# Patient Record
Sex: Male | Born: 1945 | Race: White | Hispanic: No | Smoking: Never smoker
Health system: Southern US, Community
[De-identification: ages and names within clinical notes are randomized; demographics above are authoritative.]

## PROBLEM LIST (undated history)

## (undated) ENCOUNTER — Emergency Department: Payer: Self-pay | Source: Home / Self Care

## (undated) HISTORY — PX: TONSILLECTOMY: SUR1361

## (undated) HISTORY — PX: APPENDECTOMY: SHX54

## (undated) HISTORY — PX: OTHER SURGICAL HISTORY: SHX169

---

## 2016-03-26 ENCOUNTER — Ambulatory Visit (INDEPENDENT_AMBULATORY_CARE_PROVIDER_SITE_OTHER): Payer: Medicare Other | Admitting: Internal Medicine

## 2016-03-26 VITALS — BP 138/84 | HR 72 | Temp 97.6°F | Resp 16 | Ht 68.5 in | Wt 232.2 lb

## 2016-03-26 DIAGNOSIS — R0789 Other chest pain: Secondary | ICD-10-CM | POA: Diagnosis not present

## 2016-03-26 NOTE — Progress Notes (Signed)
   Subjective:  By signing my name below, I, Stann Oresung-Kai Tsai, attest that this documentation has been prepared under the direction and in the presence of Ellamae Siaobert Tomothy Eddins, MD. Electronically Signed: Stann Oresung-Kai Tsai, Scribe. 03/26/2016 , 1:54 PM .  Patient was seen in Room 14 .   Patient ID: Steve Buchanan, male    DOB: 08/02/1946, 70 y.o.   MRN: [id number] Chief Complaint  Patient presents with  . Bronchitis   HPI Steve Buchanan is a 70 y.o. male who has h/o bronchitis [facility] complaining of left side chest discomfort ongoing for a few days. Patient states he's had intermittent bronchitis since he was in college and usually occurs in winter. He's taken mucinex for this and soreness hasn't resolved. He denies fever, coughing up at night, wheezing, or shortness of breath. He denies history of asthma.   He's been helping his ex-wife here in [place]. His ex-wife has been taking care of her father at home.   There are no active problems to display for this patient. no ongoing problems or meds No current outpatient prescriptions on file. Not on File  Review of Systems  Constitutional: Negative for fever, chills, appetite change and fatigue.  HENT: Negative for rhinorrhea and sore throat.   Respiratory: Positive for chest tightness. Negative for cough, shortness of breath and wheezing.   Gastrointestinal: Negative for nausea, vomiting, abdominal pain and diarrhea.      Objective:   Physical Exam  Constitutional: He is oriented to person, place, and time. He appears well-developed and well-nourished. No distress.  HENT:  Head: Normocephalic and atraumatic.  Eyes: EOM are normal. Pupils are equal, round, and reactive to light.  Neck: Neck supple.  Cardiovascular: Normal rate, regular rhythm and normal heart sounds.   No murmur heard. Pulmonary/Chest: Effort normal and breath sounds normal. No respiratory distress. He has no wheezes.  tender along left axillary line at rib margin  without swelling or defect  Musculoskeletal: Normal range of motion.  Neurological: He is alert and oriented to person, place, and time.  Skin: Skin is warm and dry.  Psychiatric: He has a normal mood and affect. His behavior is normal.  Nursing note and vitals reviewed.   BP 138/84 mmHg  Pulse 72  Temp(Src) 97.6 F (36.4 C)  Resp 16  Ht 5' 8.5" (1.74 m)  Wt 232 lb 3.2 oz (105.325 kg)  BMI 34.79 kg/m2  SpO2 96%    Assessment & Plan:  Chest wall pain  Stretch/heat/ibuprof F/u 2 w if not well  I have completed the patient encounter in its entirety as documented by the scribe, with editing by me where necessary. Ambre Kobayashi P. Merla Richesoolittle, M.D.

## 2016-03-26 NOTE — Patient Instructions (Signed)
     IF you received an x-ray today, you will receive an invoice from [facility]. Please contact  Radiology at [phone] with questions or concerns regarding your invoice.   IF you received labwork today, you will receive an invoice from [facility]. Please contact [facility] at [phone] with questions or concerns regarding your invoice.   Our billing staff will not be able to assist you with questions regarding bills from these companies.  You will be contacted with the lab results as soon as they are available. The fastest way to get your results is to activate your My Chart account. Instructions are located on the last page of this paperwork. If you have not heard from us regarding the results in 2 weeks, please contact this office.      

## 2016-04-14 ENCOUNTER — Ambulatory Visit (INDEPENDENT_AMBULATORY_CARE_PROVIDER_SITE_OTHER): Payer: Medicare Other

## 2016-04-14 ENCOUNTER — Ambulatory Visit (INDEPENDENT_AMBULATORY_CARE_PROVIDER_SITE_OTHER): Payer: Medicare Other | Admitting: Family Medicine

## 2016-04-14 VITALS — BP 138/88 | HR 79 | Temp 98.3°F | Resp 16 | Ht 69.0 in | Wt 231.0 lb

## 2016-04-14 DIAGNOSIS — R059 Cough, unspecified: Secondary | ICD-10-CM

## 2016-04-14 DIAGNOSIS — R05 Cough: Secondary | ICD-10-CM

## 2016-04-14 DIAGNOSIS — R0789 Other chest pain: Secondary | ICD-10-CM

## 2016-04-14 DIAGNOSIS — M545 Low back pain, unspecified: Secondary | ICD-10-CM

## 2016-04-14 DIAGNOSIS — Z8679 Personal history of other diseases of the circulatory system: Secondary | ICD-10-CM | POA: Diagnosis not present

## 2016-04-14 DIAGNOSIS — Z87898 Personal history of other specified conditions: Secondary | ICD-10-CM

## 2016-04-14 DIAGNOSIS — J3489 Other specified disorders of nose and nasal sinuses: Secondary | ICD-10-CM

## 2016-04-14 MED ORDER — FLUTICASONE PROPIONATE 50 MCG/ACT NA SUSP: 1.0000 | Freq: Every day | NASAL | Status: DC

## 2016-04-14 MED ORDER — BENZONATATE 100 MG PO CAPS: 100.0000 mg | ORAL_CAPSULE | Freq: Three times a day (TID) | ORAL | Status: DC | PRN

## 2016-04-14 MED ORDER — ALBUTEROL SULFATE HFA 108 (90 BASE) MCG/ACT IN AERS
1.0000 | INHALATION_SPRAY | RESPIRATORY_TRACT | Status: DC | PRN
Start: 2016-04-14 — End: 2018-11-08

## 2016-04-14 MED ORDER — CYCLOBENZAPRINE HCL 5 MG PO TABS: ORAL_TABLET | ORAL | Status: DC

## 2016-04-14 NOTE — Progress Notes (Signed)
Subjective:  By signing my name below, I, Stann Ore, attest that this documentation has been prepared under the direction and in the presence of Meredith Staggers, MD. Electronically Signed: Stann Ore, Scribe. 04/14/2016 , 3:30 PM .  Patient was seen in Room 14 .   Patient ID: Steve Buchanan, male    DOB: [birth year], 70 y.o.   MRN: [id number] Chief Complaint  Patient presents with  . Back Pain    lower left   HPI Steve Buchanan is a 70 y.o. male Here for back pain and elevated BP during triage. Of note, BP was normal during June 21st visit.   Patient was seen here about 2.5 weeks ago with left side chest discomfort ongoing for a few days at that time. He reports the discomfort has radiated towards his right side chest and to his back now. He states his cough has been worsening with continued humidity in the area and when he's outside. He reports coughing so much yesterday, "it feels like he was in a straight jacket". He's been taking aleve once a day or 1 every other day. He's also been taking nyquil for cough and slight rhinorrhea.   He denies history of asthma. He denies heartburn in years. He denies postnasal drip or sore throat. He denies chest tightness, chest pain, or shortness of breath with exertion or activity. He denies heart issues. He denies history of HTN. He denies any recent tobacco use. He denies urinary symptoms, pain in legs, or leg swelling. He denies history of DVT's.   He mentions taking cholesterol medication in the past.   He lives [place], [place]. He's here in [place], helping his ex-wife take care of her father.   There are no active problems to display for this patient.  History reviewed. No pertinent past medical history. Past Surgical History  Procedure Laterality Date  . Appendectomy     No Known Allergies Prior to Admission medications   Medication Sig Start Date End Date Taking? Authorizing Provider  Naproxen Sodium (ALEVE PO) Take by  mouth.   Yes Historical Provider, MD   Social History   Social History  . Marital Status: Divorced    Spouse Name: N/A  . Number of Children: N/A  . Years of Education: N/A   Occupational History  . Not on file.   Social History Main Topics  . Smoking status: Never Smoker   . Smokeless tobacco: Not on file  . Alcohol Use: Not on file  . Drug Use: Not on file  . Sexual Activity: Not on file   Other Topics Concern  . Not on file   Social History Narrative   Review of Systems  Constitutional: Negative for fatigue and unexpected weight change.  HENT: Positive for rhinorrhea.   Eyes: Negative for visual disturbance.  Respiratory: Positive for cough. Negative for chest tightness and shortness of breath.   Cardiovascular: Negative for chest pain, palpitations and leg swelling.  Gastrointestinal: Negative for abdominal pain and blood in stool.  Musculoskeletal: Positive for back pain.  Neurological: Negative for dizziness, light-headedness and headaches.       Objective:   Physical Exam  Constitutional: He is oriented to person, place, and time. He appears well-developed and well-nourished.  HENT:  Head: Normocephalic and atraumatic.  Eyes: EOM are normal. Pupils are equal, round, and reactive to light.  Neck: No JVD present. Carotid bruit is not present.  Cardiovascular: Normal rate, regular rhythm and normal heart sounds.   No  murmur heard. Pulmonary/Chest: Effort normal and breath sounds normal. He has no rales.  Abdominal: There is no CVA tenderness.  Musculoskeletal: He exhibits no edema.  Minimal tenderness over left paraspinal of the upper L-spine, no midline bony tenderness, calves non tender, no lower extremity edema  Neurological: He is alert and oriented to person, place, and time.  Skin: Skin is warm and dry.  Psychiatric: He has a normal mood and affect.  Vitals reviewed.   Filed Vitals:   04/14/16 1430 04/14/16 1454  BP: 144/82 155/104  Pulse: 79     Temp: 98.3 F (36.8 C)   Resp: 16   Height: 5\' 9"  (1.753 m)   Weight: 231 lb (104.781 kg)   SpO2: 97%    EKG: sinus rhythm, some baseline artifact, non specific t-wave in lead III, no acute findings  Dg Chest 2 View  04/14/2016  CLINICAL DATA:  70 year old male with posterior chest wall pain for several weeks, nonproductive cough. EXAM: CHEST  2 VIEW COMPARISON:  None. FINDINGS: There is tortuosity of the thoracic aorta. No definite calcified aortic atherosclerosis. Cardiac size is within normal limits, but there is evidence of a small to moderate gastric hiatal hernia. Other mediastinal contours are within normal limits. Visualized tracheal air column is within normal limits. One or more small calcified granulomas project in the left mid lung near the hilum. No pneumothorax or pulmonary edema. No pleural effusion or other confluent pulmonary opacity. Exaggerated thoracic kyphosis. Degenerative endplate spurring in the thoracic spine. No acute osseous abnormality identified. IMPRESSION: No acute cardiopulmonary abnormality. Tortuous thoracic aorta. Gastric hiatal hernia also suspected. Electronically Signed   By: Odessa Fleming M.D.   On: 04/14/2016 16:01      Assessment & Plan:    Steve Buchanan is a 70 y.o. male Cough - Plan: D-dimer, quantitative (not at [facility]), DG Chest 2 View, fluticasone (FLONASE) 50 MCG/ACT nasal spray, albuterol (PROVENTIL HFA;VENTOLIN HFA) 108 (90 Base) MCG/ACT inhaler, benzonatate (TESSALON) 100 MG capsule  Chest wall pain - Plan: D-dimer, quantitative (not at [facility]), DG Chest 2 View  Left-sided low back pain without sciatica - Plan: cyclobenzaprine (FLEXERIL) 5 MG tablet  History of palpitations - Plan: EKG 12-Lead, TSH  Rhinorrhea - Plan: fluticasone (FLONASE) 50 MCG/ACT nasal spray  Persistent cough with associated chest wall pain and left low back pain by exam. No acute concerning findings on chest x-ray, lungs clear currently. Notices symptoms when around  heat, may have reactive airway component. Also with hiatal hernia, may have reflux component as well as postnasal drip from allergies.   -We'll try to treat for multiple causes: avoid foods that cause reflux, over-the-counter Zantac if needed, and start Flonase for postnasal drip. Tessalon Perles or Mucinex DM as needed for cough, and a trial of albuterol for possible reactive airway.   - for chest wall pain, back pain, can try Flexeril at bedtime, nsaid during the day if his blood pressure is not elevated.  -Check d-dimer as travels back and forth to [facility], but no other known risk factor for DVT, or previous calf pain/swelling.Marland Kitchen  Re: Palpitations, no concerning findings on EKG, copy given to patient. TSH pending as history of thyroid disease, but advised to follow-up with his primary care provider back in [place] for this discussion further. ER/RTC/911 precautions given in the meantime.  Meds ordered this encounter  Medications  . Naproxen Sodium (ALEVE PO)    Sig: Take by mouth.  . fluticasone (FLONASE) 50 MCG/ACT nasal spray  Sig: Place 1 spray into both nostrils daily.    Dispense:  16 g    Refill:  1  . albuterol (PROVENTIL HFA;VENTOLIN HFA) 108 (90 Base) MCG/ACT inhaler    Sig: Inhale 1-2 puffs into the lungs every 4 (four) hours as needed for wheezing or shortness of breath.    Dispense:  1 Inhaler    Refill:  0  . benzonatate (TESSALON) 100 MG capsule    Sig: Take 1 capsule (100 mg total) by mouth 3 (three) times daily as needed for cough.    Dispense:  20 capsule    Refill:  0  . cyclobenzaprine (FLEXERIL) 5 MG tablet    Sig: 1 pill by mouth up to every 8 hours as needed. Start with one pill by mouth each bedtime as needed due to sedation    Dispense:  15 tablet    Refill:  0   Patient Instructions       IF you received an x-ray today, you will receive an invoice from [facility]. Please contact [facility] at [phone] with questions or  concerns regarding your invoice.   IF you received labwork today, you will receive an invoice from [employer]. Please contact [facility] at [phone] with questions or concerns regarding your invoice.   Our billing staff will not be able to assist you with questions regarding bills from these companies.  You will be contacted with the lab results as soon as they are available. The fastest way to get your results is to activate your My Chart account. Instructions are located on the last page of this paperwork. If you have not heard from [place] regarding the results in 2 weeks, please contact this office.     See information below on cough. Often times a cough such as yours can be due to multiple causes, including heartburn, allergies with postnasal drip, or irritation of the airways. Avoid foods known to cause heartburn, and I have listed below. You can try the Flonase nasal spray to see if allergies may be triggering some of your cough. During times of cough you can try the albuterol inhaler to see if there is some reactive airway or wheezing component to your cough. If this helps, there are other medications you can use daily to suppress your cough - this can be discussed with your primary care provider at follow up. I also checked a blood clot test, but I suspect this will be normal. If elevated, further testing with a CAT scan may be needed. We will let you know if this is the case.  I would also like you to meet with your primary care provider when you get back in town regarding your palpitations, and previous thyroid issues. I did check a thyroid test here today, and will have the results back within the next 2 weeks. You can also take a copy of your EKG that was performed today to your doctor when you return to [facility].  Tessalon Perles may be helpful to block the cough during the day, or if these are too expensive, can try over-the-counter Mucinex DM. Aleve or  ibuprofen is okay for the chest wall pain, but watch your blood pressure as it was elevated today, and these medicines can also cause elevation of blood pressure. The pain may also be radiating pain from your back, and I have provided a muscle relaxer at bedtime if this is helpful. Follow-up with your primary care provider in the next 1-2  weeks regarding your back, as well as other issues as above.  Return to the clinic or go to the nearest emergency room if any of your symptoms worsen or new symptoms occur.   Cough, Adult Coughing is a reflex that clears your throat and your airways. Coughing helps to heal and protect your lungs. It is normal to cough occasionally, but a cough that happens with other symptoms or lasts a long time may be a sign of a condition that needs treatment. A cough may last only 2-3 weeks (acute), or it may last longer than 8 weeks (chronic). CAUSES Coughing is commonly caused by:  Breathing in substances that irritate your lungs.  A viral or bacterial respiratory infection.  Allergies.  Asthma.  Postnasal drip.  Smoking.  Acid backing up from the stomach into the esophagus (gastroesophageal reflux).  Certain medicines.  Chronic lung problems, including COPD (or rarely, lung cancer).  Other medical conditions such as heart failure. HOME CARE INSTRUCTIONS  Pay attention to any changes in your symptoms. Take these actions to help with your discomfort:  Take medicines only as told by your health care provider.  If you were prescribed an antibiotic medicine, take it as told by your health care provider. Do not stop taking the antibiotic even if you start to feel better.  Talk with your health care provider before you take a cough suppressant medicine.  Drink enough fluid to keep your urine clear or pale yellow.  If the air is dry, use a cold steam vaporizer or humidifier in your bedroom or your home to help loosen secretions.  Avoid anything that causes  you to cough at work or at home.  If your cough is worse at night, try sleeping in a semi-upright position.  Avoid cigarette smoke. If you smoke, quit smoking. If you need help quitting, ask your health care provider.  Avoid caffeine.  Avoid alcohol.  Rest as needed. SEEK MEDICAL CARE IF:   You have new symptoms.  You cough up pus.  Your cough does not get better after 2-3 weeks, or your cough gets worse.  You cannot control your cough with suppressant medicines and you are losing sleep.  You develop pain that is getting worse or pain that is not controlled with pain medicines.  You have a fever.  You have unexplained weight loss.  You have night sweats. SEEK IMMEDIATE MEDICAL CARE IF:  You cough up blood.  You have difficulty breathing.  Your heartbeat is very fast.   This information is not intended to replace advice given to you by your health care provider. Make sure you discuss any questions you have with your health care provider.   Document Released: 03/21/2011 Document Revised: 06/13/2015 Document Reviewed: 11/29/2014 Elsevier Interactive Patient Education 2016 [employer]  Food Choices for Gastroesophageal Reflux Disease, Adult When you have gastroesophageal reflux disease (GERD), the foods you eat and your eating habits are very important. Choosing the right foods can help ease the discomfort of GERD. WHAT GENERAL GUIDELINES DO I NEED TO FOLLOW?  Choose fruits, vegetables, whole grains, low-fat dairy products, and low-fat meat, fish, and poultry.  Limit fats such as oils, salad dressings, butter, nuts, and avocado.  Keep a food diary to identify foods that cause symptoms.  Avoid foods that cause reflux. These may be different for different people.  Eat frequent small meals instead of three large meals each day.  Eat your meals slowly, in a relaxed setting.  Limit fried foods.  Cook foods using methods other than frying.  Avoid drinking  alcohol.  Avoid drinking large amounts of liquids with your meals.  Avoid bending over or lying down until 2-3 hours after eating. WHAT FOODS ARE NOT RECOMMENDED? The following are some foods and drinks that may worsen your symptoms: Vegetables Tomatoes. Tomato juice. Tomato and spaghetti sauce. Chili peppers. Onion and garlic. Horseradish. Fruits Oranges, grapefruit, and lemon (fruit and juice). Meats High-fat meats, fish, and poultry. This includes hot dogs, ribs, ham, sausage, salami, and bacon. Dairy Whole milk and chocolate milk. Sour cream. Cream. Butter. Ice cream. Cream cheese.  Beverages Coffee and tea, with or without caffeine. Carbonated beverages or energy drinks. Condiments Hot sauce. Barbecue sauce.  Sweets/Desserts Chocolate and cocoa. Donuts. Peppermint and spearmint. Fats and Oils High-fat foods, including [place] fries and potato chips. Other Vinegar. Strong spices, such as black pepper, white pepper, red pepper, cayenne, curry powder, cloves, ginger, and chili powder. The items listed above may not be a complete list of foods and beverages to avoid. Contact your dietitian for more information.   This information is not intended to replace advice given to you by your health care provider. Make sure you discuss any questions you have with your health care provider.   Document Released: 09/22/2005 Document Revised: 10/13/2014 Document Reviewed: 07/27/2013 Elsevier Interactive Patient Education 2016 [employer]. Palpitations A palpitation is the feeling that your heartbeat is irregular or is faster than normal. It may feel like your heart is fluttering or skipping a beat. Palpitations are usually not a serious problem. However, in some cases, you may need further medical evaluation. CAUSES  Palpitations can be caused by:  Smoking.  Caffeine or other stimulants, such as diet pills or energy drinks.  Alcohol.  Stress and anxiety.  Strenuous physical  activity.  Fatigue.  Certain medicines.  Heart disease, especially if you have a history of irregular heart rhythms (arrhythmias), such as atrial fibrillation, atrial flutter, or supraventricular tachycardia.  An improperly working pacemaker or defibrillator. DIAGNOSIS  To find the cause of your palpitations, your health care provider will take your medical history and perform a physical exam. Your health care provider may also have you take a test called an ambulatory electrocardiogram (ECG). An ECG records your heartbeat patterns over a 24-hour period. You may also have other tests, such as:  Transthoracic echocardiogram (TTE). During echocardiography, sound waves are used to evaluate how blood flows through your heart.  Transesophageal echocardiogram (TEE).  Cardiac monitoring. This allows your health care provider to monitor your heart rate and rhythm in real time.  Holter monitor. This is a portable device that records your heartbeat and can help diagnose heart arrhythmias. It allows your health care provider to track your heart activity for several days, if needed.  Stress tests by exercise or by giving medicine that makes the heart beat faster. TREATMENT  Treatment of palpitations depends on the cause of your symptoms and can vary greatly. Most cases of palpitations do not require any treatment other than time, relaxation, and monitoring your symptoms. Other causes, such as atrial fibrillation, atrial flutter, or supraventricular tachycardia, usually require further treatment. HOME CARE INSTRUCTIONS   Avoid:  Caffeinated coffee, tea, soft drinks, diet pills, and energy drinks.  Chocolate.  Alcohol.  Stop smoking if you smoke.  Reduce your stress and anxiety. Things that can help you relax include:  A method of controlling things in your body, such as your heartbeats, with your mind (biofeedback).  Yoga.  Meditation.  Physical activity such as swimming, jogging, or  walking.  Get plenty of rest and sleep. SEEK MEDICAL CARE IF:   You continue to have a fast or irregular heartbeat beyond 24 hours.  Your palpitations occur more often. SEEK IMMEDIATE MEDICAL CARE IF:  You have chest pain or shortness of breath.  You have a severe headache.  You feel dizzy or you faint. MAKE SURE YOU:  Understand these instructions.  Will watch your condition.  Will get help right away if you are not doing well or get worse.   This information is not intended to replace advice given to you by your health care provider. Make sure you discuss any questions you have with your health care provider.   Document Released: 09/19/2000 Document Revised: 09/27/2013 Document Reviewed: 11/21/2011 Elsevier Interactive Patient Education 2016 Elsevier Inc.   Chest Wall Pain Chest wall pain is pain in or around the bones and muscles of your chest. Sometimes, an injury causes this pain. Sometimes, the cause may not be known. This pain may take several weeks or longer to get better. HOME CARE INSTRUCTIONS  Pay attention to any changes in your symptoms. Take these actions to help with your pain:   Rest as told by your health care provider.   Avoid activities that cause pain. These include any activities that use your chest muscles or your abdominal and side muscles to lift heavy items.   If directed, apply ice to the painful area:  Put ice in a plastic bag.  Place a towel between your skin and the bag.  Leave the ice on for 20 minutes, 2-3 times per day.  Take over-the-counter and prescription medicines only as told by your health care provider.  Do not use tobacco products, including cigarettes, chewing tobacco, and e-cigarettes. If you need help quitting, ask your health care provider.  Keep all follow-up visits as told by your health care provider. This is important. SEEK MEDICAL CARE IF:  You have a fever.  Your chest pain becomes worse.  You have new  symptoms. SEEK IMMEDIATE MEDICAL CARE IF:  You have nausea or vomiting.  You feel sweaty or light-headed.  You have a cough with phlegm (sputum) or you cough up blood.  You develop shortness of breath.   This information is not intended to replace advice given to you by your health care provider. Make sure you discuss any questions you have with your health care provider.   Document Released: 09/22/2005 Document Revised: 06/13/2015 Document Reviewed: 12/18/2014 Elsevier Interactive Patient Education [employer]    I personally performed the services described in this documentation, which was scribed in my presence. The recorded information has been reviewed and considered, and addended by me as needed.   Signed,   Meredith Staggers, MD [facility].  04/14/2016 4:10 PM

## 2016-04-14 NOTE — Patient Instructions (Addendum)
IF you received an x-ray today, you will receive an invoice from [facility]. Please contact [facility] at [phone] with questions or concerns regarding your invoice.   IF you received labwork today, you will receive an invoice from [employer]. Please contact [facility] at [phone] with questions or concerns regarding your invoice.   Our billing staff will not be able to assist you with questions regarding bills from these companies.  You will be contacted with the lab results as soon as they are available. The fastest way to get your results is to activate your My Chart account. Instructions are located on the last page of this paperwork. If you have not heard from [place] regarding the results in 2 weeks, please contact this office.     See information below on cough. Often times a cough such as yours can be due to multiple causes, including heartburn, allergies with postnasal drip, or irritation of the airways. Avoid foods known to cause heartburn, and I have listed these below. Zantac over-the-counter if needed for heartburn. You can try the Flonase nasal spray to see if allergies may be triggering some of your cough. During times of cough you can also try the albuterol inhaler to see if there is some reactive airway or wheezing component to your cough. If this helps, there are other medications you can use daily to suppress your cough - this can be discussed with your primary care provider at follow up. I also checked a blood clot test, but I suspect this will be normal. If elevated, further testing with a CAT scan may be needed. We will let you know if this is the case.  I would also like you to meet with your primary care provider when you get back in town regarding your palpitations, and previous thyroid issues. I did check a thyroid test here today, and will have the results back within the next 2 weeks. You can also take a copy of your EKG  that was performed today to your doctor when you return to [facility].  Tessalon Perles may be helpful to block the cough during the day, or if these are too expensive, can try over-the-counter Mucinex DM. Aleve or ibuprofen is okay for the chest wall pain, but watch your blood pressure as it was elevated today, and these medicines can also cause elevation of blood pressure. The pain may also be radiating pain from your back, and I have provided a muscle relaxer at bedtime if this is helpful. Follow-up with your primary care provider in the next 1-2 weeks regarding your back, as well as other issues as above.  Return to the clinic or go to the nearest emergency room if any of your symptoms worsen or new symptoms occur.   Cough, Adult Coughing is a reflex that clears your throat and your airways. Coughing helps to heal and protect your lungs. It is normal to cough occasionally, but a cough that happens with other symptoms or lasts a long time may be a sign of a condition that needs treatment. A cough may last only 2-3 weeks (acute), or it may last longer than 8 weeks (chronic). CAUSES Coughing is commonly caused by:  Breathing in substances that irritate your lungs.  A viral or bacterial respiratory infection.  Allergies.  Asthma.  Postnasal drip.  Smoking.  Acid backing up from the stomach into the esophagus (gastroesophageal reflux).  Certain medicines.  Chronic lung problems, including COPD (or rarely,  lung cancer).  Other medical conditions such as heart failure. HOME CARE INSTRUCTIONS  Pay attention to any changes in your symptoms. Take these actions to help with your discomfort:  Take medicines only as told by your health care provider.  If you were prescribed an antibiotic medicine, take it as told by your health care provider. Do not stop taking the antibiotic even if you start to feel better.  Talk with your health care provider before you take a cough suppressant  medicine.  Drink enough fluid to keep your urine clear or pale yellow.  If the air is dry, use a cold steam vaporizer or humidifier in your bedroom or your home to help loosen secretions.  Avoid anything that causes you to cough at work or at home.  If your cough is worse at night, try sleeping in a semi-upright position.  Avoid cigarette smoke. If you smoke, quit smoking. If you need help quitting, ask your health care provider.  Avoid caffeine.  Avoid alcohol.  Rest as needed. SEEK MEDICAL CARE IF:   You have new symptoms.  You cough up pus.  Your cough does not get better after 2-3 weeks, or your cough gets worse.  You cannot control your cough with suppressant medicines and you are losing sleep.  You develop pain that is getting worse or pain that is not controlled with pain medicines.  You have a fever.  You have unexplained weight loss.  You have night sweats. SEEK IMMEDIATE MEDICAL CARE IF:  You cough up blood.  You have difficulty breathing.  Your heartbeat is very fast.   This information is not intended to replace advice given to you by your health care provider. Make sure you discuss any questions you have with your health care provider.   Document Released: 03/21/2011 Document Revised: 06/13/2015 Document Reviewed: 11/29/2014 Elsevier Interactive Patient Education 2016 [employer]  Food Choices for Gastroesophageal Reflux Disease, Adult When you have gastroesophageal reflux disease (GERD), the foods you eat and your eating habits are very important. Choosing the right foods can help ease the discomfort of GERD. WHAT GENERAL GUIDELINES DO I NEED TO FOLLOW?  Choose fruits, vegetables, whole grains, low-fat dairy products, and low-fat meat, fish, and poultry.  Limit fats such as oils, salad dressings, butter, nuts, and avocado.  Keep a food diary to identify foods that cause symptoms.  Avoid foods that cause reflux. These may be different for  different people.  Eat frequent small meals instead of three large meals each day.  Eat your meals slowly, in a relaxed setting.  Limit fried foods.  Cook foods using methods other than frying.  Avoid drinking alcohol.  Avoid drinking large amounts of liquids with your meals.  Avoid bending over or lying down until 2-3 hours after eating. WHAT FOODS ARE NOT RECOMMENDED? The following are some foods and drinks that may worsen your symptoms: Vegetables Tomatoes. Tomato juice. Tomato and spaghetti sauce. Chili peppers. Onion and garlic. Horseradish. Fruits Oranges, grapefruit, and lemon (fruit and juice). Meats High-fat meats, fish, and poultry. This includes hot dogs, ribs, ham, sausage, salami, and bacon. Dairy Whole milk and chocolate milk. Sour cream. Cream. Butter. Ice cream. Cream cheese.  Beverages Coffee and tea, with or without caffeine. Carbonated beverages or energy drinks. Condiments Hot sauce. Barbecue sauce.  Sweets/Desserts Chocolate and cocoa. Donuts. Peppermint and spearmint. Fats and Oils High-fat foods, including [place] fries and potato chips. Other Vinegar. Strong spices, such as black pepper, white pepper, red pepper,  cayenne, curry powder, cloves, ginger, and chili powder. The items listed above may not be a complete list of foods and beverages to avoid. Contact your dietitian for more information.   This information is not intended to replace advice given to you by your health care provider. Make sure you discuss any questions you have with your health care provider.   Document Released: 09/22/2005 Document Revised: 10/13/2014 Document Reviewed: 07/27/2013 Elsevier Interactive Patient Education 2016 [employer]. Palpitations A palpitation is the feeling that your heartbeat is irregular or is faster than normal. It may feel like your heart is fluttering or skipping a beat. Palpitations are usually not a serious problem. However, in some cases, you may  need further medical evaluation. CAUSES  Palpitations can be caused by:  Smoking.  Caffeine or other stimulants, such as diet pills or energy drinks.  Alcohol.  Stress and anxiety.  Strenuous physical activity.  Fatigue.  Certain medicines.  Heart disease, especially if you have a history of irregular heart rhythms (arrhythmias), such as atrial fibrillation, atrial flutter, or supraventricular tachycardia.  An improperly working pacemaker or defibrillator. DIAGNOSIS  To find the cause of your palpitations, your health care provider will take your medical history and perform a physical exam. Your health care provider may also have you take a test called an ambulatory electrocardiogram (ECG). An ECG records your heartbeat patterns over a 24-hour period. You may also have other tests, such as:  Transthoracic echocardiogram (TTE). During echocardiography, sound waves are used to evaluate how blood flows through your heart.  Transesophageal echocardiogram (TEE).  Cardiac monitoring. This allows your health care provider to monitor your heart rate and rhythm in real time.  Holter monitor. This is a portable device that records your heartbeat and can help diagnose heart arrhythmias. It allows your health care provider to track your heart activity for several days, if needed.  Stress tests by exercise or by giving medicine that makes the heart beat faster. TREATMENT  Treatment of palpitations depends on the cause of your symptoms and can vary greatly. Most cases of palpitations do not require any treatment other than time, relaxation, and monitoring your symptoms. Other causes, such as atrial fibrillation, atrial flutter, or supraventricular tachycardia, usually require further treatment. HOME CARE INSTRUCTIONS   Avoid:  Caffeinated coffee, tea, soft drinks, diet pills, and energy drinks.  Chocolate.  Alcohol.  Stop smoking if you smoke.  Reduce your stress and anxiety. Things  that can help you relax include:  A method of controlling things in your body, such as your heartbeats, with your mind (biofeedback).  Yoga.  Meditation.  Physical activity such as swimming, jogging, or walking.  Get plenty of rest and sleep. SEEK MEDICAL CARE IF:   You continue to have a fast or irregular heartbeat beyond 24 hours.  Your palpitations occur more often. SEEK IMMEDIATE MEDICAL CARE IF:  You have chest pain or shortness of breath.  You have a severe headache.  You feel dizzy or you faint. MAKE SURE YOU:  Understand these instructions.  Will watch your condition.  Will get help right away if you are not doing well or get worse.   This information is not intended to replace advice given to you by your health care provider. Make sure you discuss any questions you have with your health care provider.   Document Released: 09/19/2000 Document Revised: 09/27/2013 Document Reviewed: 11/21/2011 Elsevier Interactive Patient Education 2016 Elsevier Inc.   Chest Wall Pain Chest wall pain is  pain in or around the bones and muscles of your chest. Sometimes, an injury causes this pain. Sometimes, the cause may not be known. This pain may take several weeks or longer to get better. HOME CARE INSTRUCTIONS  Pay attention to any changes in your symptoms. Take these actions to help with your pain:   Rest as told by your health care provider.   Avoid activities that cause pain. These include any activities that use your chest muscles or your abdominal and side muscles to lift heavy items.   If directed, apply ice to the painful area:  Put ice in a plastic bag.  Place a towel between your skin and the bag.  Leave the ice on for 20 minutes, 2-3 times per day.  Take over-the-counter and prescription medicines only as told by your health care provider.  Do not use tobacco products, including cigarettes, chewing tobacco, and e-cigarettes. If you need help quitting, ask  your health care provider.  Keep all follow-up visits as told by your health care provider. This is important. SEEK MEDICAL CARE IF:  You have a fever.  Your chest pain becomes worse.  You have new symptoms. SEEK IMMEDIATE MEDICAL CARE IF:  You have nausea or vomiting.  You feel sweaty or light-headed.  You have a cough with phlegm (sputum) or you cough up blood.  You develop shortness of breath.   This information is not intended to replace advice given to you by your health care provider. Make sure you discuss any questions you have with your health care provider.   Document Released: 09/22/2005 Document Revised: 06/13/2015 Document Reviewed: 12/18/2014 Elsevier Interactive Patient Education [employer]

## 2016-04-15 LAB — TSH: TSH: 1.7 m[IU]/L (ref 0.40–4.50)

## 2016-04-15 LAB — D-DIMER, QUANTITATIVE (NOT AT ARMC): D DIMER QUANT: 0.19 ug{FEU}/mL (ref <0.50)

## 2016-10-21 IMAGING — DX DG CHEST 2V
2 series · 2 of 2 positions shown · non-contrast
Comparison: None.

CLINICAL DATA: [age] year old male with posterior chest wall
pain for several weeks, nonproductive cough.

EXAM:
CHEST  2 VIEW

[chest pa]
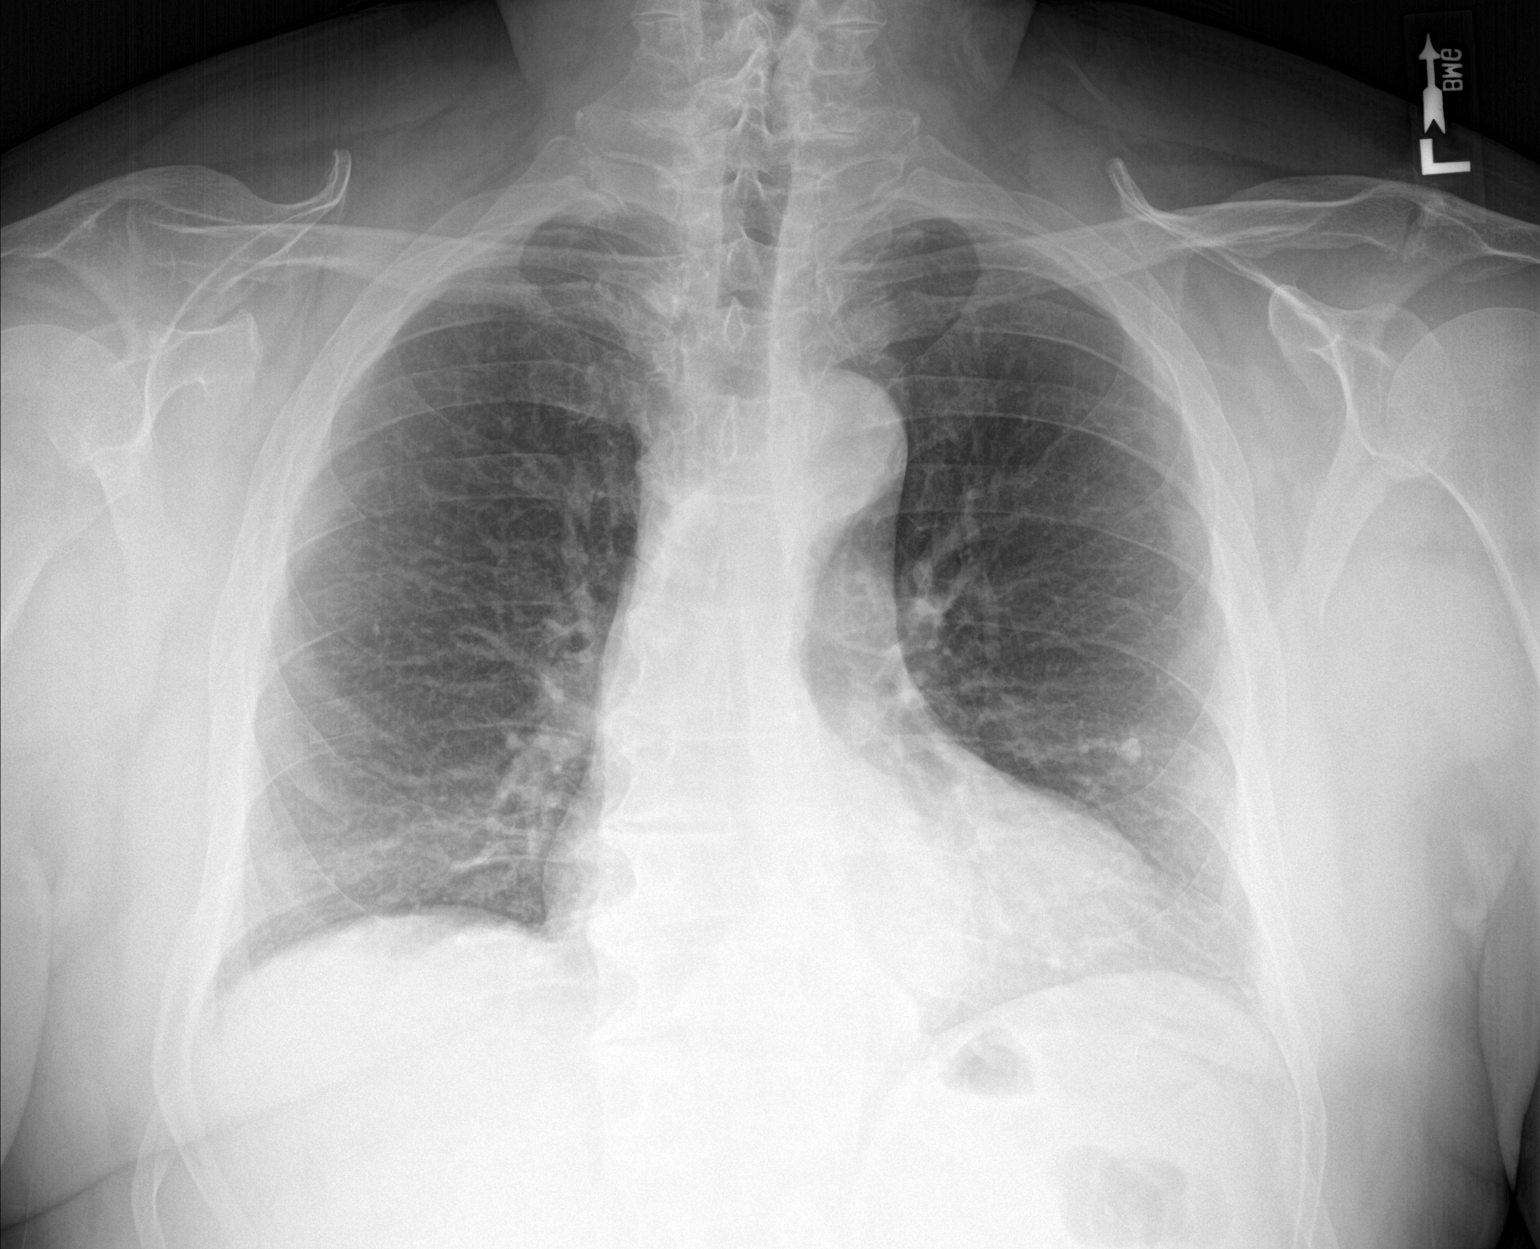

[chest lat]
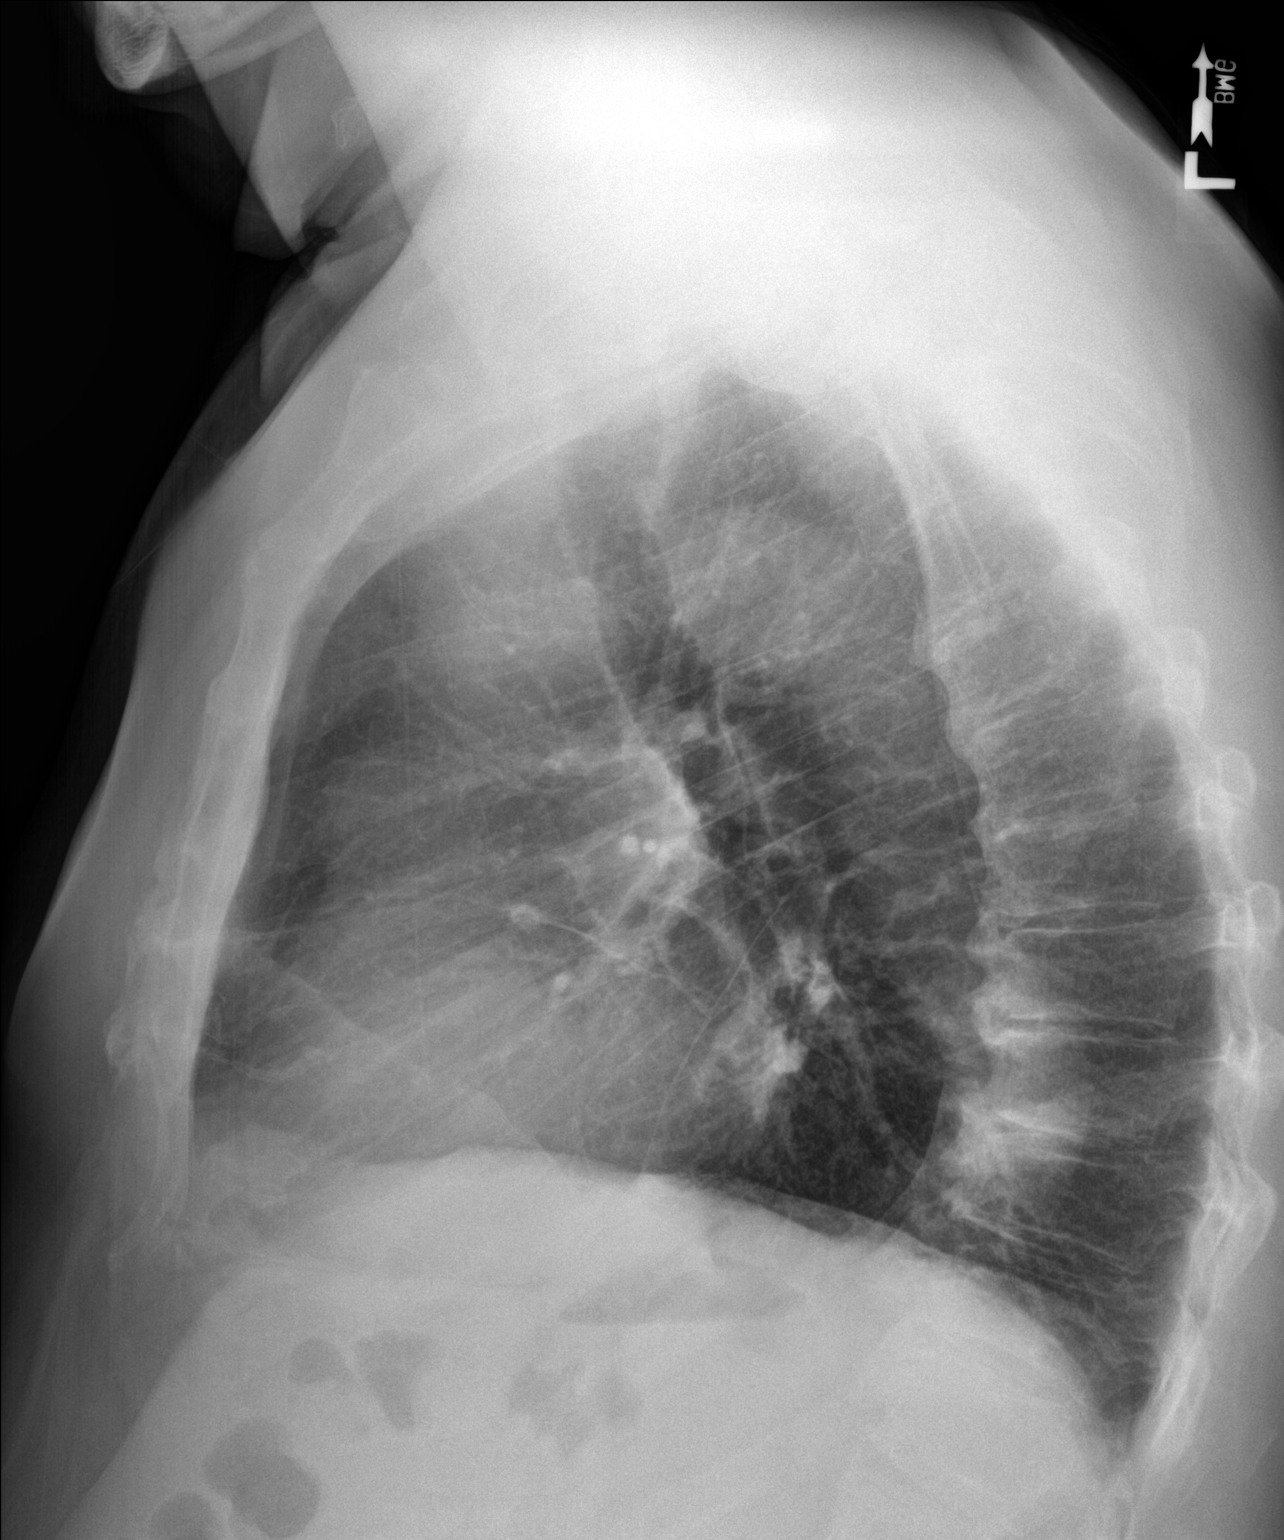

[2 of 2 positions shown; findings below may reference images not displayed]

FINDINGS: There is tortuosity of the thoracic aorta. No definite calcified
aortic atherosclerosis. Cardiac size is within normal limits, but
there is evidence of a small to moderate gastric hiatal hernia.
Other mediastinal contours are within normal limits. Visualized
tracheal air column is within normal limits. One or more small
calcified granulomas project in the left mid lung near the hilum. No
pneumothorax or pulmonary edema. No pleural effusion or other
confluent pulmonary opacity. Exaggerated thoracic kyphosis.
Degenerative endplate spurring in the thoracic spine. No acute
osseous abnormality identified.
IMPRESSION: No acute cardiopulmonary abnormality.

Tortuous thoracic aorta.

Gastric hiatal hernia also suspected.

## 2018-11-08 ENCOUNTER — Ambulatory Visit (INDEPENDENT_AMBULATORY_CARE_PROVIDER_SITE_OTHER): Payer: Medicare Other | Admitting: Family Medicine

## 2018-11-08 ENCOUNTER — Encounter: Payer: Self-pay | Admitting: Family Medicine

## 2018-11-08 ENCOUNTER — Other Ambulatory Visit: Payer: Self-pay

## 2018-11-08 VITALS — BP 132/80 | HR 88 | Temp 98.5°F | Ht 69.0 in | Wt 232.0 lb

## 2018-11-08 DIAGNOSIS — K429 Umbilical hernia without obstruction or gangrene: Secondary | ICD-10-CM

## 2018-11-08 DIAGNOSIS — M25561 Pain in right knee: Secondary | ICD-10-CM | POA: Diagnosis not present

## 2018-11-08 DIAGNOSIS — G8929 Other chronic pain: Secondary | ICD-10-CM

## 2018-11-08 DIAGNOSIS — Z1211 Encounter for screening for malignant neoplasm of colon: Secondary | ICD-10-CM

## 2018-11-08 DIAGNOSIS — M25562 Pain in left knee: Secondary | ICD-10-CM

## 2018-11-08 DIAGNOSIS — Z23 Encounter for immunization: Secondary | ICD-10-CM

## 2018-11-08 DIAGNOSIS — Z1321 Encounter for screening for nutritional disorder: Secondary | ICD-10-CM

## 2018-11-08 DIAGNOSIS — Z1329 Encounter for screening for other suspected endocrine disorder: Secondary | ICD-10-CM

## 2018-11-08 DIAGNOSIS — Z13228 Encounter for screening for other metabolic disorders: Secondary | ICD-10-CM

## 2018-11-08 DIAGNOSIS — Z13220 Encounter for screening for lipoid disorders: Secondary | ICD-10-CM

## 2018-11-08 DIAGNOSIS — R0683 Snoring: Secondary | ICD-10-CM | POA: Diagnosis not present

## 2018-11-08 DIAGNOSIS — Z130 Encounter for screening for diseases of the blood and blood-forming organs and certain disorders involving the immune mechanism: Secondary | ICD-10-CM

## 2018-11-08 MED ORDER — PNEUMOCOCCAL 13-VAL CONJ VACC IM SUSP: 0.5000 mL | INTRAMUSCULAR | Status: DC

## 2018-11-08 MED ORDER — DICLOFENAC SODIUM 1 % TD GEL: 2.0000 g | Freq: Four times a day (QID) | TRANSDERMAL | 0 refills | Status: DC

## 2018-11-08 NOTE — Patient Instructions (Signed)
° ° ° °  If you have lab work done today you will be contacted with your lab results within the next 2 weeks.  If you have not heard from us then please contact us. The fastest way to get your results is to register for My Chart. ° ° °IF you received an x-ray today, you will receive an invoice from [facility]. Please contact [facility] at [phone] with questions or concerns regarding your invoice.  ° °IF you received labwork today, you will receive an invoice from [facility]. Please contact [facility] at [phone] with questions or concerns regarding your invoice.  ° °Our billing staff will not be able to assist you with questions regarding bills from these companies. ° °You will be contacted with the lab results as soon as they are available. The fastest way to get your results is to activate your My Chart account. Instructions are located on the last page of this paperwork. If you have not heard from us regarding the results in 2 weeks, please contact this office. °  ° ° ° °

## 2018-11-08 NOTE — Progress Notes (Signed)
2/3/202010:16 AM  Steve Buchanan 1946/07/31, 73 y.o. male [id number]  Chief Complaint  Patient presents with  . Establish Care    not taking any medications this time. Has not been to  the doctor in years. Had shingle shot this past Friday at [facility]    HPI:   Patient is a 73 y.o. male who presents today to establish care  Has not seen doctor in a many years Currently living in [place] about moving permanently to [place] Has some arthritis in his knees but doing better [employer] as needed  Does not smoke Drinks very rarely  When stressed, he gets this feeling of "unwell/unease" Sitting down and holding his breath makes it go away Happening less frequent of recent Denies any palpitations, SOB, chest pain with these Believes these are panic attacks  Labs are non-fasting  Getting more comfortable with being retired  Has had flu vaccine and shingrix #1 Has never had pneumonia vaccines  Fall Risk  11/08/2018 04/14/2016 03/26/2016  Falls in the past year? 0 No No  Number falls in past yr: 0 - -  Injury with Fall? 0 - -     Depression screen [facility] 2/9 11/08/2018 04/14/2016 03/26/2016  Decreased Interest 0 0 0  Down, Depressed, Hopeless 0 0 0  PHQ - 2 Score 0 0 0    No Known Allergies  Prior to Admission medications   Not on File    History reviewed. No pertinent past medical history.  Past Surgical History:  Procedure Laterality Date  . APPENDECTOMY      Social History   Tobacco Use  . Smoking status: Never Smoker  . Smokeless tobacco: Never Used  Substance Use Topics  . Alcohol use: Not Currently    Alcohol/week: 0.0 standard drinks    History reviewed. No pertinent family history.  Review of Systems  Constitutional: Negative for chills and fever.  Respiratory: Negative for cough and shortness of breath.   Cardiovascular: Negative for chest pain, palpitations and leg swelling.  Gastrointestinal: Negative for abdominal pain, blood in stool,  constipation, diarrhea, melena, nausea and vomiting.       Has umbilical hernia, denies any pain, reports it is soft  Genitourinary: Positive for frequency and urgency.       Nocturia x 2  Musculoskeletal: Positive for joint pain.  Psychiatric/Behavioral: The patient is nervous/anxious and has insomnia.        Snoring, but denies headaches, daytime somnolence     OBJECTIVE:  Blood pressure 132/80, pulse 88, temperature 98.5 F (36.9 C), temperature source Oral, height '5\' 9"'$  (1.753 m), weight 232 lb (105.2 kg), SpO2 96 %. Body mass index is 34.26 kg/m.   Neck circumference: 16.5cm Stop bang points 4  Physical Exam Vitals signs and nursing note reviewed.  Constitutional:      Appearance: He is well-developed.  HENT:     Head: Normocephalic and atraumatic.  Eyes:     Conjunctiva/sclera: Conjunctivae normal.     Pupils: Pupils are equal, round, and reactive to light.  Neck:     Musculoskeletal: Neck supple.  Cardiovascular:     Rate and Rhythm: Normal rate and regular rhythm.     Heart sounds: No murmur. No friction rub. No gallop.   Pulmonary:     Effort: Pulmonary effort is normal.     Breath sounds: Normal breath sounds. No wheezing or rales.  Abdominal:     General: Bowel sounds are normal. There is no distension.  Palpations: Abdomen is soft.     Tenderness: There is no abdominal tenderness.     Hernia: A hernia (small, umbilical, reducible, non tender, no overlying skin changes) is present.  Skin:    General: Skin is warm and dry.  Neurological:     Mental Status: He is alert and oriented to person, place, and time.  Psychiatric:        Mood and Affect: Mood is anxious.      ASSESSMENT and PLAN  1. Chronic pain of both knees Overall doing well, trial of diclofenac gel  2. Snoring Intermediate risk stop bang, offered referral to sleep medicine, declines at this time  3. Umbilical hernia without obstruction and without gangrene No acute issues. Declines  referral to gen surg. RTC precaution discussed.  4. Screening for colon cancer - Cologuard  5. Screening for endocrine, nutritional, metabolic and immunity disorder - CMP14+EGFR  6. Screening for thyroid disorder - TSH  7. Screening for lipid disorders - Lipid panel  8. Need for vaccination - pneumococcal 13-valent conjugate vaccine (PREVNAR 13) injection 0.5 mL - Pneumococcal conjugate vaccine 13-valent IM  Other orders - diclofenac sodium (VOLTAREN) 1 % GEL; Apply 2 g topically 4 (four) times daily.   Return if symptoms worsen or fail to improve.    Rutherford Guys, MD Primary Care at [place], [place] [address] Ph.  [phone] Fax [phone]

## 2018-11-09 LAB — LIPID PANEL
Chol/HDL Ratio: 5.4 ratio — ABNORMAL HIGH (ref 0.0–5.0)
Cholesterol, Total: 183 mg/dL (ref 100–199)
HDL: 34 mg/dL — ABNORMAL LOW (ref >39)
LDL Calculated: 103 mg/dL — ABNORMAL HIGH (ref 0–99)
Triglycerides: 230 mg/dL — ABNORMAL HIGH (ref 0–149)
VLDL Cholesterol Cal: 46 mg/dL — ABNORMAL HIGH (ref 5–40)

## 2018-11-09 LAB — CMP14+EGFR
ALT: 15 IU/L (ref 0–44)
AST: 14 IU/L (ref 0–40)
Albumin/Globulin Ratio: 1.7 (ref 1.2–2.2)
Albumin: 4.7 g/dL (ref 3.8–4.8)
Alkaline Phosphatase: 75 IU/L (ref 39–117)
BUN/Creatinine Ratio: 22 (ref 10–24)
BUN: 17 mg/dL (ref 8–27)
Bilirubin Total: 0.6 mg/dL (ref 0.0–1.2)
CO2: 19 mmol/L — ABNORMAL LOW (ref 20–29)
Calcium: 9.9 mg/dL (ref 8.6–10.2)
Chloride: 101 mmol/L (ref 96–106)
Creatinine, Ser: 0.77 mg/dL (ref 0.76–1.27)
GFR calc Af Amer: 106 mL/min/{1.73_m2} (ref >59)
GFR calc non Af Amer: 92 mL/min/{1.73_m2} (ref >59)
Globulin, Total: 2.7 g/dL (ref 1.5–4.5)
Glucose: 95 mg/dL (ref 65–99)
Potassium: 4.4 mmol/L (ref 3.5–5.2)
Sodium: 138 mmol/L (ref 134–144)
Total Protein: 7.4 g/dL (ref 6.0–8.5)

## 2018-11-09 LAB — TSH: TSH: 3.27 u[IU]/mL (ref 0.450–4.500)

## 2018-11-09 LAB — HEMOGLOBIN A1C
Est. average glucose Bld gHb Est-mCnc: 114 mg/dL
Hgb A1c MFr Bld: 5.6 % (ref 4.8–5.6)

## 2018-11-19 ENCOUNTER — Telehealth: Payer: Self-pay | Admitting: Family Medicine

## 2018-11-19 NOTE — Telephone Encounter (Signed)
Copied from CRM [id number]. Topic: General - Inquiry >> Nov 19, 2018  9:02 AM Maia Petties wrote: Reason for CRM: Pt called to check on status of cologuard. Pt requesting call back. He is needing cologuard to be mailed to: [address] [place]  [id number]

## 2018-11-22 NOTE — Telephone Encounter (Signed)
Cologuard has been sent

## 2018-11-23 ENCOUNTER — Encounter: Payer: Self-pay | Admitting: Radiology

## 2020-02-10 ENCOUNTER — Encounter: Payer: Self-pay | Admitting: Family Medicine

## 2020-02-10 ENCOUNTER — Other Ambulatory Visit: Payer: Self-pay

## 2020-02-10 ENCOUNTER — Ambulatory Visit (INDEPENDENT_AMBULATORY_CARE_PROVIDER_SITE_OTHER): Payer: Medicare Other | Admitting: Family Medicine

## 2020-02-10 VITALS — BP 122/84 | HR 70 | Temp 98.0°F | Ht 69.0 in | Wt 218.0 lb

## 2020-02-10 DIAGNOSIS — Z1211 Encounter for screening for malignant neoplasm of colon: Secondary | ICD-10-CM | POA: Diagnosis not present

## 2020-02-10 DIAGNOSIS — G8929 Other chronic pain: Secondary | ICD-10-CM | POA: Diagnosis not present

## 2020-02-10 DIAGNOSIS — M25562 Pain in left knee: Secondary | ICD-10-CM

## 2020-02-10 DIAGNOSIS — Z23 Encounter for immunization: Secondary | ICD-10-CM | POA: Diagnosis not present

## 2020-02-10 NOTE — Progress Notes (Signed)
5/7/202111:44 AM  Winfield Cunas March 20, 1946, 74 y.o., male [id number]  Chief Complaint  Patient presents with  . Follow-up    Knee pain L  . Fall    02/07/20 fell on R shoulder outdoors / tripped    HPI:   Patient is a 74 y.o. 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 It caused him to trip 3 days ago and hurt his shoulder which is fine, however it made him think that it might be time to seek out treatment for his left knee  Patient reports that about 10 years ago previous ortho mentioned that he had advanced OA  He has never had any treatment  He is wondering about injections  When he sits or stands for too long it gets stiff and hurts when he changes position Hurts the most when he walks Has found that he need to lift his foot in order to avoid tripping Sometimes pain radiates down ant chin He denies any locking or giving out Losing weight has helped He denies any back, hip or ankle pain He denies any numbness or tingling     Depression screen [facility] 2/9 11/08/2018 04/14/2016 03/26/2016  Decreased Interest 0 0 0  Down, Depressed, Hopeless 0 0 0  PHQ - 2 Score 0 0 0    Fall Risk  11/08/2018 04/14/2016 03/26/2016  Falls in the past year? 0 No No  Number falls in past yr: 0 - -  Injury with Fall? 0 - -     No Known Allergies  Prior to Admission medications   Medication Sig Start Date End Date Taking? Authorizing Provider  diclofenac sodium (VOLTAREN) 1 % GEL Apply 2 g topically 4 (four) times daily. 11/08/18   Myles Lipps, MD    No past medical history on file.  Past Surgical History:  Procedure Laterality Date  . APPENDECTOMY      Social History   Tobacco Use  . Smoking status: Never Smoker  . Smokeless tobacco: Never Used  Substance Use Topics  . Alcohol use: Not Currently    Alcohol/week: 0.0 standard drinks    No  family history on file.  Review of Systems  Constitutional: Negative for chills and fever.  Respiratory: Negative for cough and shortness of breath.   Cardiovascular: Negative for chest pain, palpitations and leg swelling.  Gastrointestinal: Negative for abdominal pain, nausea and vomiting.   Per hpi  OBJECTIVE:  Today's Vitals   02/10/20 1152 02/10/20 1222  BP: (!) 155/101 122/84  Pulse: 70   Temp: 98 F (36.7 C)   SpO2: 96%   Weight: 218 lb (98.9 kg)   Height: 5\' 9"  (1.753 m)    Body mass index is 32.19 kg/m.   Wt Readings from Last 3 Encounters:  02/10/20 218 lb (98.9 kg)  11/08/18 232 lb (105.2 kg)  04/14/16 231 lb (104.8 kg)   BP Readings from Last 3 Encounters:  02/10/20 (!) 155/101  11/08/18 132/80  04/14/16 138/88     Physical Exam Vitals and nursing note reviewed.  Constitutional:      Appearance: He is well-developed.  HENT:     Head: Normocephalic and atraumatic.  Eyes:     Conjunctiva/sclera: Conjunctivae normal.     Pupils: Pupils are equal, round, and reactive to light.  Cardiovascular:     Rate  and Rhythm: Normal rate and regular rhythm.     Heart sounds: No murmur. No friction rub. No gallop.   Pulmonary:     Effort: Pulmonary effort is normal.     Breath sounds: Normal breath sounds. No wheezing or rales.  Musculoskeletal:     Cervical back: Neck supple.     Left hip: Normal.     Left knee: No swelling, deformity or effusion. Normal range of motion. No tenderness. No LCL laxity, MCL laxity, ACL laxity or PCL laxity.Normal meniscus and normal patellar mobility.     Left ankle: Normal.  Skin:    General: Skin is warm and dry.  Neurological:     Mental Status: He is alert and oriented to person, place, and time.     No results found for this or any previous visit (from the past 24 hour(s)).  No results found.   ASSESSMENT and PLAN  1. Chronic pain of left knee - Ambulatory referral to Orthopedic Surgery  2. Need for  vaccination - Pneumococcal polysaccharide vaccine 23-valent greater than or equal to 2yo subcutaneous/IM  3. Screening for colon cancer - Cologuard  Return if symptoms worsen or fail to improve.    Rutherford Guys, MD Primary Care at [place], [place] [address] Ph.  [phone] Fax [phone]

## 2020-02-10 NOTE — Patient Instructions (Addendum)
  COVID-19 Vaccine Information can be found at: [email or web address] For questions related to vaccine distribution or appointments, please email [email or web address] or call [phone].     If you have lab work done today you will be contacted with your lab results within the next 2 weeks.  If you have not heard from us then please contact us. The fastest way to get your results is to register for My Chart.   IF you received an x-ray today, you will receive an invoice from [facility]. Please contact [facility] at [phone] with questions or concerns regarding your invoice.   IF you received labwork today, you will receive an invoice from [facility]. Please contact [facility] at [phone] with questions or concerns regarding your invoice.   Our billing staff will not be able to assist you with questions regarding bills from these companies.  You will be contacted with the lab results as soon as they are available. The fastest way to get your results is to activate your My Chart account. Instructions are located on the last page of this paperwork. If you have not heard from us regarding the results in 2 weeks, please contact this office.       

## 2020-02-16 ENCOUNTER — Encounter: Payer: Self-pay | Admitting: Family Medicine

## 2020-02-16 ENCOUNTER — Ambulatory Visit: Payer: Medicare Other | Admitting: Family Medicine

## 2020-02-16 ENCOUNTER — Other Ambulatory Visit: Payer: Self-pay

## 2020-02-16 ENCOUNTER — Telehealth: Payer: Self-pay

## 2020-02-16 ENCOUNTER — Ambulatory Visit: Payer: Self-pay

## 2020-02-16 DIAGNOSIS — G8929 Other chronic pain: Secondary | ICD-10-CM

## 2020-02-16 DIAGNOSIS — M25562 Pain in left knee: Secondary | ICD-10-CM | POA: Diagnosis not present

## 2020-02-16 NOTE — Progress Notes (Signed)
   Office Visit Note   Patient: Steve Buchanan           Date of Birth: Nov 02, 1945           MRN: [id number] Visit Date: 02/16/2020 Requested by: Myles Lipps, MD [address] [place],  [place] [address] PCP: Myles Lipps, MD  Subjective: Chief Complaint  Patient presents with  . Left Knee - Pain    Chronic pain. Has been told he has OA in the past. Hurts mainly with much standing. Not as much pain now that he has retired. No pain this morning. Interested in gel injections.    HPI: He is here with left knee pain.  Longstanding problems with his knee, he was told that he had arthritis in the past.  He has dealt with his pain without medication for the most part, he tried Voltaren gel but it did not really make much difference.  Lately his knee has gotten more stiff, he cannot sit or stand for very long.  It is hard to fully extend his knee.  He has pain anteriorly radiating toward his foot sometimes.  No locking or giving way.  He is interested in gel injections if appropriate.              ROS:   All other systems were reviewed and are negative.  Objective: Vital Signs: There were no vitals taken for this visit.  Physical Exam:  General:  Alert and oriented, in no acute distress. Pulm:  Breathing unlabored. Psy:  Normal mood, congruent affect. Skin: No erythema or rash Left knee: 2+ patellofemoral crepitus in both knees.  He lacks about 5 degrees from full extension on the left, his flexion motion is about 120 degrees.  He has 1+ effusion with no warmth.  There is some tenderness around the patellofemoral joint and a little bit on the medial and lateral joint lines.  No ligamentous laxity detected.  Imaging: XR KNEE 3 VIEW LEFT  Result Date: 02/16/2020 X-rays left knee: He has tricompartmental DJD, most pronounced in the medial compartment and patellofemoral joint.  No sign of loose body.   Assessment & Plan: 1.  Left knee osteoarthritis -We discussed various options, and  I agree that he would be a good candidate for gel injections.  We will request approval for this.  He would prefer that over cortisone.  He has concerns about long-term harm with cortisone.     Procedures: No procedures performed  No notes on file     PMFS History: There are no problems to display for this patient.  No past medical history on file.  No family history on file.  Past Surgical History:  Procedure Laterality Date  . APPENDECTOMY     Social History   Occupational History  . Not on file  Tobacco Use  . Smoking status: Never Smoker  . Smokeless tobacco: Never Used  Substance and Sexual Activity  . Alcohol use: Not Currently    Alcohol/week: 0.0 standard drinks  . Drug use: Not Currently  . Sexual activity: Not Currently

## 2020-02-16 NOTE — Telephone Encounter (Signed)
-----   Message from Lavada Mesi, MD sent at 02/16/2020  9:48 AM EDT ----- Please request approval for gel injections for left knee OA.

## 2020-02-17 ENCOUNTER — Telehealth: Payer: Self-pay

## 2020-02-17 NOTE — Telephone Encounter (Signed)
Noted  

## 2020-02-17 NOTE — Telephone Encounter (Signed)
Submitted VOB for SynviscOne, left knee. 

## 2020-02-17 NOTE — Progress Notes (Signed)
Duplicate

## 2020-02-21 ENCOUNTER — Telehealth: Payer: Self-pay

## 2020-02-21 NOTE — Telephone Encounter (Signed)
Patient aware that he is approved for gel injection.  SynviscOne, left knee. Buy & Bill Patient will be responsible for 20% OOP. Co-pay of $35.00 required No PA required  Appt. 02/24/2020 with Dr. Prince Rome

## 2020-02-24 ENCOUNTER — Other Ambulatory Visit: Payer: Self-pay

## 2020-02-24 ENCOUNTER — Encounter: Payer: Self-pay | Admitting: Family Medicine

## 2020-02-24 ENCOUNTER — Ambulatory Visit (INDEPENDENT_AMBULATORY_CARE_PROVIDER_SITE_OTHER): Payer: Medicare Other | Admitting: Family Medicine

## 2020-02-24 ENCOUNTER — Ambulatory Visit: Payer: Self-pay

## 2020-02-24 DIAGNOSIS — M1712 Unilateral primary osteoarthritis, left knee: Secondary | ICD-10-CM

## 2020-02-24 NOTE — Progress Notes (Signed)
Subjective: He is here for a planned ultrasound-guided left knee Synvisc 1 injection.  Objective: Trace effusion with no warmth or erythema.  Procedure: Ultrasound-guided left knee Synvisc 1 injection: After sterile prep with Betadine, injected 5 cc 1% lidocaine without epinephrine and Synvisc 1 from superolateral approach into the joint recess, injectate was seen filling the joint capsule.

## 2020-02-29 ENCOUNTER — Telehealth: Payer: Self-pay | Admitting: *Deleted

## 2020-02-29 NOTE — Telephone Encounter (Signed)
Schedule AWV.  

## 2020-05-10 DIAGNOSIS — H35373 Puckering of macula, bilateral: Secondary | ICD-10-CM | POA: Diagnosis not present

## 2020-05-10 DIAGNOSIS — H02834 Dermatochalasis of left upper eyelid: Secondary | ICD-10-CM | POA: Diagnosis not present

## 2020-05-10 DIAGNOSIS — H40012 Open angle with borderline findings, low risk, left eye: Secondary | ICD-10-CM | POA: Diagnosis not present

## 2020-05-10 DIAGNOSIS — H02831 Dermatochalasis of right upper eyelid: Secondary | ICD-10-CM | POA: Diagnosis not present

## 2020-05-10 DIAGNOSIS — H2513 Age-related nuclear cataract, bilateral: Secondary | ICD-10-CM | POA: Diagnosis not present

## 2020-06-26 ENCOUNTER — Telehealth: Payer: Self-pay | Admitting: Family Medicine

## 2020-06-26 NOTE — Telephone Encounter (Signed)
Noted.   Please assist with pt scheduling a TOC visit or a follow up with a new provider per pt request.

## 2020-06-26 NOTE — Telephone Encounter (Signed)
LVMTCB to scheduled toc with either Dr. Neva Seat or Dr. Alvy Bimler

## 2020-06-26 NOTE — Telephone Encounter (Signed)
Pt states he received letter about Dr Leretha Pol leaving. Pt states he would like to switch to Dr Chilton Si or Dr Alvy Bimler. Pt states he would like to stay at this practice.

## 2020-07-16 ENCOUNTER — Encounter: Payer: Self-pay | Admitting: Family Medicine

## 2020-07-16 ENCOUNTER — Ambulatory Visit (INDEPENDENT_AMBULATORY_CARE_PROVIDER_SITE_OTHER): Payer: Medicare Other | Admitting: Family Medicine

## 2020-07-16 ENCOUNTER — Other Ambulatory Visit: Payer: Self-pay

## 2020-07-16 VITALS — BP 160/105 | HR 79 | Temp 97.1°F | Ht 69.0 in | Wt 213.0 lb

## 2020-07-16 DIAGNOSIS — Z13220 Encounter for screening for lipoid disorders: Secondary | ICD-10-CM | POA: Diagnosis not present

## 2020-07-16 DIAGNOSIS — Z23 Encounter for immunization: Secondary | ICD-10-CM

## 2020-07-16 DIAGNOSIS — H9191 Unspecified hearing loss, right ear: Secondary | ICD-10-CM

## 2020-07-16 DIAGNOSIS — Z6831 Body mass index (BMI) 31.0-31.9, adult: Secondary | ICD-10-CM

## 2020-07-16 DIAGNOSIS — I10 Essential (primary) hypertension: Secondary | ICD-10-CM

## 2020-07-16 DIAGNOSIS — E7849 Other hyperlipidemia: Secondary | ICD-10-CM

## 2020-07-16 DIAGNOSIS — H9313 Tinnitus, bilateral: Secondary | ICD-10-CM

## 2020-07-16 MED ORDER — LISINOPRIL 10 MG PO TABS: 10.0000 mg | ORAL_TABLET | Freq: Every day | ORAL | 3 refills | Status: DC

## 2020-07-16 NOTE — Patient Instructions (Addendum)
Please get a home BP monitor, and check your BP daily Goal< 140/90  Please Schedule an annual physical  Hypertension, Adult Hypertension is another name for high blood pressure. High blood pressure forces your heart to work harder to pump blood. This can cause problems over time. There are two numbers in a blood pressure reading. There is a top number (systolic) over a bottom number (diastolic). It is best to have a blood pressure that is below 120/80. Healthy choices can help lower your blood pressure, or you may need medicine to help lower it. What are the causes? The cause of this condition is not known. Some conditions may be related to high blood pressure. What increases the risk?  Smoking.  Having type 2 diabetes mellitus, high cholesterol, or both.  Not getting enough exercise or physical activity.  Being overweight.  Having too much fat, sugar, calories, or salt (sodium) in your diet.  Drinking too much alcohol.  Having long-term (chronic) kidney disease.  Having a family history of high blood pressure.  Age. Risk increases with age.  Race. You may be at higher risk if you are African American.  Gender. Men are at higher risk than women before age 57. After age 9, women are at higher risk than men.  Having obstructive sleep apnea.  Stress. What are the signs or symptoms?  High blood pressure may not cause symptoms. Very high blood pressure (hypertensive crisis) may cause: ? Headache. ? Feelings of worry or nervousness (anxiety). ? Shortness of breath. ? Nosebleed. ? A feeling of being sick to your stomach (nausea). ? Throwing up (vomiting). ? Changes in how you see. ? Very bad chest pain. ? Seizures. How is this treated?  This condition is treated by making healthy lifestyle changes, such as: ? Eating healthy foods. ? Exercising more. ? Drinking less alcohol.  Your health care provider may prescribe medicine if lifestyle changes are not enough to get  your blood pressure under control, and if: ? Your top number is above 130. ? Your bottom number is above 80.  Your personal target blood pressure may vary. Follow these instructions at home: Eating and drinking   If told, follow the DASH eating plan. To follow this plan: ? Fill one half of your plate at each meal with fruits and vegetables. ? Fill one fourth of your plate at each meal with whole grains. Whole grains include whole-wheat pasta, brown rice, and whole-grain bread. ? Eat or drink low-fat dairy products, such as skim milk or low-fat yogurt. ? Fill one fourth of your plate at each meal with low-fat (lean) proteins. Low-fat proteins include fish, chicken without skin, eggs, beans, and tofu. ? Avoid fatty meat, cured and processed meat, or chicken with skin. ? Avoid pre-made or processed food.  Eat less than 1,500 mg of salt each day.  Do not drink alcohol if: ? Your doctor tells you not to drink. ? You are pregnant, may be pregnant, or are planning to become pregnant.  If you drink alcohol: ? Limit how much you use to:  0-1 drink a day for women.  0-2 drinks a day for men. ? Be aware of how much alcohol is in your drink. In the [place] one drink equals one 12 oz bottle of beer (355 mL), one 5 oz glass of wine (148 mL), or one 1 oz glass of hard liquor (44 mL). Lifestyle   Work with your doctor to stay at a healthy weight or to lose  weight. Ask your doctor what the best weight is for you.  Get at least 30 minutes of exercise most days of the week. This may include walking, swimming, or biking.  Get at least 30 minutes of exercise that strengthens your muscles (resistance exercise) at least 3 days a week. This may include lifting weights or doing Pilates.  Do not use any products that contain nicotine or tobacco, such as cigarettes, e-cigarettes, and chewing tobacco. If you need help quitting, ask your doctor.  Check your blood pressure at home as told by your  doctor.  Keep all follow-up visits as told by your doctor. This is important. Medicines  Take over-the-counter and prescription medicines only as told by your doctor. Follow directions carefully.  Do not skip doses of blood pressure medicine. The medicine does not work as well if you skip doses. Skipping doses also puts you at risk for problems.  Ask your doctor about side effects or reactions to medicines that you should watch for. Contact a doctor if you:  Think you are having a reaction to the medicine you are taking.  Have headaches that keep coming back (recurring).  Feel dizzy.  Have swelling in your ankles.  Have trouble with your vision. Get help right away if you:  Get a very bad headache.  Start to feel mixed up (confused).  Feel weak or numb.  Feel faint.  Have very bad pain in your: ? Chest. ? Belly (abdomen).  Throw up more than once.  Have trouble breathing. Summary  Hypertension is another name for high blood pressure.  High blood pressure forces your heart to work harder to pump blood.  For most people, a normal blood pressure is less than 120/80.  Making healthy choices can help lower blood pressure. If your blood pressure does not get lower with healthy choices, you may need to take medicine. This information is not intended to replace advice given to you by your health care provider. Make sure you discuss any questions you have with your health care provider. Document Revised: 06/02/2018 Document Reviewed: 06/02/2018 Elsevier Patient Education  [employer].     If you have lab work done today you will be contacted with your lab results within the next 2 weeks.  If you have not heard from [place] then please contact us. The fastest way to get your results is to register for My Chart.   IF you received an x-ray today, you will receive an invoice from [facility]. Please contact [facility] at [phone] with questions or  concerns regarding your invoice.   IF you received labwork today, you will receive an invoice from [facility]. Please contact [facility] at [phone] with questions or concerns regarding your invoice.   Our billing staff will not be able to assist you with questions regarding bills from these companies.  You will be contacted with the lab results as soon as they are available. The fastest way to get your results is to activate your My Chart account. Instructions are located on the last page of this paperwork. If you have not heard from [place] regarding the results in 2 weeks, please contact this office.         If you have lab work done today you will be contacted with your lab results within the next 2 weeks.  If you have not heard from [place] then please contact us. The fastest way to get your results is to register for My Chart.  IF you received an x-ray today, you will receive an invoice from [facility]. Please contact [facility] at [phone] with questions or concerns regarding your invoice.   IF you received labwork today, you will receive an invoice from [facility]. Please contact [facility] at [phone] with questions or concerns regarding your invoice.   Our billing staff will not be able to assist you with questions regarding bills from these companies.  You will be contacted with the lab results as soon as they are available. The fastest way to get your results is to activate your My Chart account. Instructions are located on the last page of this paperwork. If you have not heard from us regarding the results in 2 weeks, please contact this office.     

## 2020-07-16 NOTE — Progress Notes (Signed)
10/12/20218:01 AM  Steve Buchanan 1946-01-24, 74 y.o., male [id number]  Chief Complaint  Patient presents with  . transfer of care  . hearing concerns    getting over a 2 week cough and is also asking for a referral to specialist  . panic attacks    Q  2 weeks to 4 months   HPI:   Patient is a 74 y.o. male with no past medical history significant who presents today for concerns for hearing and potential panic attacks.  Every year he gets a severe cough lasts about two weeks  Cough occurred before his first covid shot, took mucinex after 2 weeks back to normal Dry cough now and Ears feel full. Tinnitus at baseline: hard of hearing at baseline. Needs referral (audiology vs ENT) Years ago was told in [place] had poor hearing but now worse.  Lives with ex wife Ortho: knee pain injections and has helped greatly with his pain Doesn't take BP at home  Episodes occur about once a month that he believes to be panic attacks. They do not affect his daily life.  Flu shot with first covid   Depression screen [facility] 2/9 07/16/2020 02/10/2020 11/08/2018  Decreased Interest 0 0 0  Down, Depressed, Hopeless 0 0 0  PHQ - 2 Score 0 0 0    Fall Risk  07/16/2020 02/10/2020 11/08/2018 04/14/2016 03/26/2016  Falls in the past year? 0 1 0 No No  Number falls in past yr: 0 0 0 - -  Injury with Fall? 0 0 0 - -  Follow up Falls evaluation completed Falls evaluation completed - - -     No Known Allergies        Past Surgical History:  Procedure Laterality Date  . APPENDECTOMY    . TONSILLECTOMY     at age 59    Social History   Tobacco Use  . Smoking status: Never Smoker  . Smokeless tobacco: Never Used  Substance Use Topics  . Alcohol use: Not Currently    Alcohol/week: 0.0 standard drinks    No family history on file.  Review of Systems  Constitutional: Negative for malaise/fatigue.  HENT: Positive for hearing loss and tinnitus. Negative for congestion, ear discharge, ear pain  and sinus pain.   Eyes: Negative for blurred vision, double vision and pain.  Respiratory: Negative for cough, shortness of breath and wheezing.   Cardiovascular: Negative for chest pain, palpitations and leg swelling.  Gastrointestinal: Negative for abdominal pain, blood in stool, constipation, diarrhea, heartburn, nausea and vomiting.  Genitourinary: Negative for dysuria, frequency and hematuria.  Musculoskeletal: Positive for joint pain. Negative for back pain.  Neurological: Negative for dizziness, weakness and headaches.  Endo/Heme/Allergies: Does not bruise/bleed easily.  Psychiatric/Behavioral: Negative for suicidal ideas. The patient is not nervous/anxious.      OBJECTIVE:  Today's Vitals   07/16/20 1359 07/16/20 1409  BP: (!) 175/97 (!) 160/105  Pulse: 79   Temp: (!) 97.1 F (36.2 C)   SpO2: 97%   Weight: 213 lb (96.6 kg)   Height: $Remove'5\' 9"'HLKEcqY$  (1.753 m)    Wt Readings from Last 3 Encounters:  07/16/20 213 lb (96.6 kg)  02/10/20 218 lb (98.9 kg)  11/08/18 232 lb (105.2 kg)    Body mass index is 31.45 kg/m.  BP Readings from Last 3 Encounters:  07/16/20 (!) 160/105  02/10/20 122/84  11/08/18 132/80   Immunization History  Administered Date(s) Administered  . Influenza,inj,Quad PF,6+ Mos 07/02/2018  . Influenza-Unspecified 07/12/2020  .  PFIZER SARS-COV-2 Vaccination 06/21/2020, 07/12/2020  . Pneumococcal Conjugate-13 11/08/2018  . Pneumococcal Polysaccharide-23 02/10/2020  . Zoster Recombinat (Shingrix) 11/05/2018, 07/16/2020   Health Maintenance Due  Topic Date Due  . Hepatitis C Screening  Never done  . TETANUS/TDAP  Never done  . COLONOSCOPY  Never done   Outpatient Encounter Medications as of 07/16/2020  Medication Sig  . lisinopril (ZESTRIL) 10 MG tablet Take 1 tablet (10 mg total) by mouth daily.  . [DISCONTINUED] diclofenac sodium (VOLTAREN) 1 % GEL Apply 2 g topically 4 (four) times daily. (Patient not taking: Reported on 02/10/2020)   No  facility-administered encounter medications on file as of 07/16/2020.     Physical Exam Constitutional:      Appearance: Normal appearance.  HENT:     Head: Normocephalic and atraumatic.     Right Ear: Tympanic membrane, ear canal and external ear normal.     Left Ear: Tympanic membrane, ear canal and external ear normal.  Cardiovascular:     Rate and Rhythm: Normal rate and regular rhythm.     Pulses: Normal pulses.     Heart sounds: Normal heart sounds.  Pulmonary:     Effort: Pulmonary effort is normal.     Breath sounds: Normal breath sounds.  Skin:    Comments: Discoloration to lower legs     Results for orders placed or performed in visit on 07/16/20 (from the past 24 hour(s))  CMP14+EGFR     Status: Abnormal   Collection Time: 07/16/20  3:13 PM  Result Value Ref Range   Glucose 87 65 - 99 mg/dL   BUN 19 8 - 27 mg/dL   Creatinine, Ser 0.67 (L) 0.76 - 1.27 mg/dL   GFR calc non Af Amer 96 >59 mL/min/1.73   GFR calc Af Amer 111 >59 mL/min/1.73   BUN/Creatinine Ratio 28 (H) 10 - 24   Sodium 141 134 - 144 mmol/L   Potassium 4.6 3.5 - 5.2 mmol/L   Chloride 107 (H) 96 - 106 mmol/L   CO2 20 20 - 29 mmol/L   Calcium 9.3 8.6 - 10.2 mg/dL   Total Protein 7.3 6.0 - 8.5 g/dL   Albumin 4.4 3.7 - 4.7 g/dL   Globulin, Total 2.9 1.5 - 4.5 g/dL   Albumin/Globulin Ratio 1.5 1.2 - 2.2   Bilirubin Total 0.3 0.0 - 1.2 mg/dL   Alkaline Phosphatase 72 44 - 121 IU/L   AST 16 0 - 40 IU/L   ALT 20 0 - 44 IU/L   Narrative   Performed at:  [facility] [address] [place], [place]  [id number] Lab Director: Rush Farmer MD, Phone:  [phone]  Lipid panel     Status: Abnormal   Collection Time: 07/16/20  3:13 PM  Result Value Ref Range   Cholesterol, Total 171 100 - 199 mg/dL   Triglycerides 173 (H) 0 - 149 mg/dL   HDL 31 (L) >39 mg/dL   VLDL Cholesterol Cal 31 5 - 40 mg/dL   LDL Chol Calc (NIH) 109 (H) 0 - 99 mg/dL   Chol/HDL Ratio 5.5 (H) 0.0 - 5.0 ratio    Narrative   Performed at:  [address], [place], [place]  [id number] Lab Director: Rush Farmer MD, Phone:  [phone]  Hemoglobin A1c     Status: Abnormal   Collection Time: 07/16/20  3:13 PM  Result Value Ref Range   Hgb A1c MFr Bld 5.9 (H) 4.8 - 5.6 %   Est.  average glucose Bld gHb Est-mCnc 123 mg/dL   Narrative   Performed at:  [address], [place], [place]  [id number] Lab Director: Rush Farmer MD, Phone:  [phone]  CBC     Status: None   Collection Time: 07/16/20  3:13 PM  Result Value Ref Range   WBC 10.3 3.4 - 10.8 x10E3/uL   RBC 4.85 4.14 - 5.80 x10E6/uL   Hemoglobin 15.2 13.0 - 17.7 g/dL   Hematocrit 46.0 37.5 - 51.0 %   MCV 95 79 - 97 fL   MCH 31.3 26.6 - 33.0 pg   MCHC 33.0 31 - 35 g/dL   RDW 12.2 11.6 - 15.4 %   Platelets 356 150 - 450 x10E3/uL   Narrative   Performed at:  [address] [place], [place]  [id number] Lab Director: Rush Farmer MD, Phone:  [phone]    No results found.   ASSESSMENT and PLAN  1. BMI 31.0-31.9,adult - CMP14+EGFR - Hemoglobin A1c - CBC  2. Essential hypertension - CMP14+EGFR - lisinopril (ZESTRIL) 10 MG tablet; Take 1 tablet (10 mg total) by mouth daily.  Dispense: 90 tablet; Refill: 3 Encouraged to get a home BP monitor Educated on BP goal<140/90 Will follow up at annual physical  3. Encounter for screening for lipid disorder - Lipid panel  Current 10-Year ASCVD Risk: 36.9% High  Low-dose aspirin (75-100 mg oral daily) should not be administered on a routine basis for the primary prevention of ASCVD among adults > 5 years old. (III: Harm, B-R)     For adults with confirmed hypertension and known CVD or 10-year ASCVD event risk of 10% or higher, a BP target of less than 130/80 mm Hg is recommended (SBP:I, B; DBP:I, C).  Maximally-tolerated statin initiation is recommended for high risk patients with LDL-C 70-189 mg/dL (1.7 to 4.8  mmol/L) to reduce LDL-C ? 50%. (I,A) ACSVD Risk: 36.9%  Lifestyle modification (diet and exercise) No low dose aspirin due to age>70 Initiate statin Atorvastatin 20mg  Daily   4. Encounter for immunization - Varicella-zoster vaccine IM  5. Decreased hearing of right ear - Ambulatory referral to ENT  6. Tinnitus of both ears - Ambulatory referral to ENT  7. Screenings Health Maint. Tetanus? Colonoscopy?    Return in about 3 weeks (around 08/06/2020) for Schedule Annual Physical. Follow up for annual physical    Huston Foley Markell Sciascia, FNP-BC [facility] [place], [place] [address] Ph.  [phone] Fax [phone]

## 2020-07-17 DIAGNOSIS — I10 Essential (primary) hypertension: Secondary | ICD-10-CM | POA: Insufficient documentation

## 2020-07-17 DIAGNOSIS — E785 Hyperlipidemia, unspecified: Secondary | ICD-10-CM | POA: Insufficient documentation

## 2020-07-17 DIAGNOSIS — H9319 Tinnitus, unspecified ear: Secondary | ICD-10-CM | POA: Insufficient documentation

## 2020-07-17 LAB — LIPID PANEL
Chol/HDL Ratio: 5.5 ratio — ABNORMAL HIGH (ref 0.0–5.0)
Cholesterol, Total: 171 mg/dL (ref 100–199)
HDL: 31 mg/dL — ABNORMAL LOW (ref >39)
LDL Chol Calc (NIH): 109 mg/dL — ABNORMAL HIGH (ref 0–99)
Triglycerides: 173 mg/dL — ABNORMAL HIGH (ref 0–149)
VLDL Cholesterol Cal: 31 mg/dL (ref 5–40)

## 2020-07-17 LAB — CMP14+EGFR
ALT: 20 IU/L (ref 0–44)
AST: 16 IU/L (ref 0–40)
Albumin/Globulin Ratio: 1.5 (ref 1.2–2.2)
Albumin: 4.4 g/dL (ref 3.7–4.7)
Alkaline Phosphatase: 72 IU/L (ref 44–121)
BUN/Creatinine Ratio: 28 — ABNORMAL HIGH (ref 10–24)
BUN: 19 mg/dL (ref 8–27)
Bilirubin Total: 0.3 mg/dL (ref 0.0–1.2)
CO2: 20 mmol/L (ref 20–29)
Calcium: 9.3 mg/dL (ref 8.6–10.2)
Chloride: 107 mmol/L — ABNORMAL HIGH (ref 96–106)
Creatinine, Ser: 0.67 mg/dL — ABNORMAL LOW (ref 0.76–1.27)
GFR calc Af Amer: 111 mL/min/{1.73_m2} (ref >59)
GFR calc non Af Amer: 96 mL/min/{1.73_m2} (ref >59)
Globulin, Total: 2.9 g/dL (ref 1.5–4.5)
Glucose: 87 mg/dL (ref 65–99)
Potassium: 4.6 mmol/L (ref 3.5–5.2)
Sodium: 141 mmol/L (ref 134–144)
Total Protein: 7.3 g/dL (ref 6.0–8.5)

## 2020-07-17 LAB — CBC
Hematocrit: 46 % (ref 37.5–51.0)
Hemoglobin: 15.2 g/dL (ref 13.0–17.7)
MCH: 31.3 pg (ref 26.6–33.0)
MCHC: 33 g/dL (ref 31.5–35.7)
MCV: 95 fL (ref 79–97)
Platelets: 356 10*3/uL (ref 150–450)
RBC: 4.85 x10E6/uL (ref 4.14–5.80)
RDW: 12.2 % (ref 11.6–15.4)
WBC: 10.3 10*3/uL (ref 3.4–10.8)

## 2020-07-17 LAB — HEMOGLOBIN A1C
Est. average glucose Bld gHb Est-mCnc: 123 mg/dL
Hgb A1c MFr Bld: 5.9 % — ABNORMAL HIGH (ref 4.8–5.6)

## 2020-07-17 MED ORDER — ATORVASTATIN CALCIUM 20 MG PO TABS: 20.0000 mg | ORAL_TABLET | Freq: Every day | ORAL | 3 refills | Status: DC

## 2020-08-15 ENCOUNTER — Other Ambulatory Visit: Payer: Self-pay

## 2020-08-15 ENCOUNTER — Ambulatory Visit (INDEPENDENT_AMBULATORY_CARE_PROVIDER_SITE_OTHER): Payer: Medicare Other | Admitting: Otolaryngology

## 2020-08-15 ENCOUNTER — Encounter (INDEPENDENT_AMBULATORY_CARE_PROVIDER_SITE_OTHER): Payer: Self-pay | Admitting: Otolaryngology

## 2020-08-15 VITALS — Temp 97.3°F

## 2020-08-15 DIAGNOSIS — H6123 Impacted cerumen, bilateral: Secondary | ICD-10-CM | POA: Diagnosis not present

## 2020-08-15 DIAGNOSIS — H9313 Tinnitus, bilateral: Secondary | ICD-10-CM | POA: Diagnosis not present

## 2020-08-15 DIAGNOSIS — H903 Sensorineural hearing loss, bilateral: Secondary | ICD-10-CM

## 2020-08-15 NOTE — Progress Notes (Signed)
HPI: Steve Buchanan is a 74 y.o. male who presents is referred by his PCP for evaluation of hearing loss.  Patient states that he has had ringing or tinnitus in his ears for a number of years.  However over the past few years he has noticed progressive difficulty hearing adequately.  He is referred here for audiologic testing.Marland Kitchen  No past medical history on file. Past Surgical History:  Procedure Laterality Date  . APPENDECTOMY    . TONSILLECTOMY     at age 48   Social History   Socioeconomic History  . Marital status: Divorced    Spouse name: Not on file  . Number of children: Not on file  . Years of education: Not on file  . Highest education level: Not on file  Occupational History  . Not on file  Tobacco Use  . Smoking status: Never Smoker  . Smokeless tobacco: Never Used  Substance and Sexual Activity  . Alcohol use: Not Currently    Alcohol/week: 0.0 standard drinks  . Drug use: Not Currently  . Sexual activity: Not Currently  Other Topics Concern  . Not on file  Social History Narrative  . Not on file   Social Determinants of Health   Financial Resource Strain:   . Difficulty of Paying Living Expenses: Not on file  Food Insecurity:   . Worried About Programme researcher, broadcasting/film/video in the Last Year: Not on file  . Ran Out of Food in the Last Year: Not on file  Transportation Needs:   . Lack of Transportation (Medical): Not on file  . Lack of Transportation (Non-Medical): Not on file  Physical Activity:   . Days of Exercise per Week: Not on file  . Minutes of Exercise per Session: Not on file  Stress:   . Feeling of Stress : Not on file  Social Connections:   . Frequency of Communication with Friends and Family: Not on file  . Frequency of Social Gatherings with Friends and Family: Not on file  . Attends Religious Services: Not on file  . Active Member of Clubs or Organizations: Not on file  . Attends Banker Meetings: Not on file  . Marital Status: Not on  file   No family history on file. No Known Allergies Prior to Admission medications   Medication Sig Start Date End Date Taking? Authorizing Provider  atorvastatin (LIPITOR) 20 MG tablet Take 1 tablet (20 mg total) by mouth daily. 07/17/20  Yes Just, Azalee Course, FNP  lisinopril (ZESTRIL) 10 MG tablet Take 1 tablet (10 mg total) by mouth daily. 07/16/20  Yes Just, Azalee Course, FNP     Positive ROS: Otherwise negative  All other systems have been reviewed and were otherwise negative with the exception of those mentioned in the HPI and as above.  Physical Exam: Constitutional: Alert, well-appearing, no acute distress Ears: External ears without lesions or tenderness. Ear canals reveal small amount of wax buildup in both ear canals that was cleaned with curette suction and forceps.  TMs were clear bilaterally otherwise. Nasal: External nose without lesions. Clear nasal passages Oral: Lips and gums without lesions. Tongue and palate mucosa without lesions. Posterior oropharynx clear. Neck: No palpable adenopathy or masses Respiratory: Breathing comfortably  Skin: No facial/neck lesions or rash noted.  Audiogram demonstrated moderate downsloping sensorineural hearing loss in both ears with SRT's of 35 dB and type A tympanograms bilaterally.  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 Concerning the tinnitus discussed with him concerning using masking noise when the tinnitus is bad.  When he gets his hearing aids our audiologist will also review with him concerning tinnitus.   Narda Bonds, MD   CC:

## 2020-08-20 ENCOUNTER — Encounter (INDEPENDENT_AMBULATORY_CARE_PROVIDER_SITE_OTHER): Payer: Self-pay

## 2020-09-17 ENCOUNTER — Ambulatory Visit (INDEPENDENT_AMBULATORY_CARE_PROVIDER_SITE_OTHER): Payer: Medicare Other | Admitting: Family Medicine

## 2020-09-17 ENCOUNTER — Other Ambulatory Visit: Payer: Self-pay

## 2020-09-17 ENCOUNTER — Encounter: Payer: Self-pay | Admitting: Family Medicine

## 2020-09-17 VITALS — BP 145/93 | HR 79 | Temp 98.5°F | Ht 69.0 in | Wt 213.0 lb

## 2020-09-17 DIAGNOSIS — Z1329 Encounter for screening for other suspected endocrine disorder: Secondary | ICD-10-CM

## 2020-09-17 DIAGNOSIS — Z1159 Encounter for screening for other viral diseases: Secondary | ICD-10-CM | POA: Diagnosis not present

## 2020-09-17 DIAGNOSIS — Z1211 Encounter for screening for malignant neoplasm of colon: Secondary | ICD-10-CM | POA: Diagnosis not present

## 2020-09-17 DIAGNOSIS — Z6831 Body mass index (BMI) 31.0-31.9, adult: Secondary | ICD-10-CM

## 2020-09-17 DIAGNOSIS — M25562 Pain in left knee: Secondary | ICD-10-CM

## 2020-09-17 DIAGNOSIS — Z1321 Encounter for screening for nutritional disorder: Secondary | ICD-10-CM | POA: Diagnosis not present

## 2020-09-17 DIAGNOSIS — G8929 Other chronic pain: Secondary | ICD-10-CM

## 2020-09-17 DIAGNOSIS — Z23 Encounter for immunization: Secondary | ICD-10-CM

## 2020-09-17 DIAGNOSIS — I10 Essential (primary) hypertension: Secondary | ICD-10-CM

## 2020-09-17 DIAGNOSIS — Z125 Encounter for screening for malignant neoplasm of prostate: Secondary | ICD-10-CM | POA: Diagnosis not present

## 2020-09-17 MED ORDER — HYDROCHLOROTHIAZIDE 12.5 MG PO TABS: 12.5000 mg | ORAL_TABLET | Freq: Every day | ORAL | 3 refills | Status: DC

## 2020-09-17 NOTE — Patient Instructions (Addendum)
Get Tetanus shot next time you are at your pharmacy  They will call you to schedule colonoscopy  Continue daily lisinopril and atorvastatin. Order placed for daily HCTZ   Health Maintenance After Age 72 After age 68, you are at a higher risk for certain long-term diseases and infections as well as injuries from falls. Falls are a major cause of broken bones and head injuries in people who are older than age 44. Getting regular preventive care can help to keep you healthy and well. Preventive care includes getting regular testing and making lifestyle changes as recommended by your health care provider. Talk with your health care provider about:  Which screenings and tests you should have. A screening is a test that checks for a disease when you have no symptoms.  A diet and exercise plan that is right for you. What should I know about screenings and tests to prevent falls? Screening and testing are the best ways to find a health problem early. Early diagnosis and treatment give you the best chance of managing medical conditions that are common after age 59. Certain conditions and lifestyle choices may make you more likely to have a fall. Your health care provider may recommend:  Regular vision checks. Poor vision and conditions such as cataracts can make you more likely to have a fall. If you wear glasses, make sure to get your prescription updated if your vision changes.  Medicine review. Work with your health care provider to regularly review all of the medicines you are taking, including over-the-counter medicines. Ask your health care provider about any side effects that may make you more likely to have a fall. Tell your health care provider if any medicines that you take make you feel dizzy or sleepy.  Osteoporosis screening. Osteoporosis is a condition that causes the bones to get weaker. This can make the bones weak and cause them to break more easily.  Blood pressure screening. Blood  pressure changes and medicines to control blood pressure can make you feel dizzy.  Strength and balance checks. Your health care provider may recommend certain tests to check your strength and balance while standing, walking, or changing positions.  Foot health exam. Foot pain and numbness, as well as not wearing proper footwear, can make you more likely to have a fall.  Depression screening. You may be more likely to have a fall if you have a fear of falling, feel emotionally low, or feel unable to do activities that you used to do.  Alcohol use screening. Using too much alcohol can affect your balance and may make you more likely to have a fall. What actions can I take to lower my risk of falls? General instructions  Talk with your health care provider about your risks for falling. Tell your health care provider if: ? You fall. Be sure to tell your health care provider about all falls, even ones that seem minor. ? You feel dizzy, sleepy, or off-balance.  Take over-the-counter and prescription medicines only as told by your health care provider. These include any supplements.  Eat a healthy diet and maintain a healthy weight. A healthy diet includes low-fat dairy products, low-fat (lean) meats, and fiber from whole grains, beans, and lots of fruits and vegetables. Home safety  Remove any tripping hazards, such as rugs, cords, and clutter.  Install safety equipment such as grab bars in bathrooms and safety rails on stairs.  Keep rooms and walkways well-lit. Activity   Follow a regular exercise  program to stay fit. This will help you maintain your balance. Ask your health care provider what types of exercise are appropriate for you.  If you need a cane or walker, use it as recommended by your health care provider.  Wear supportive shoes that have nonskid soles. Lifestyle  Do not drink alcohol if your health care provider tells you not to drink.  If you drink alcohol, limit how  much you have: ? 0-1 drink a day for women. ? 0-2 drinks a day for men.  Be aware of how much alcohol is in your drink. In the [place] one drink equals one typical bottle of beer (12 oz), one-half glass of wine (5 oz), or one shot of hard liquor (1 oz).  Do not use any products that contain nicotine or tobacco, such as cigarettes and e-cigarettes. If you need help quitting, ask your health care provider. Summary  Having a healthy lifestyle and getting preventive care can help to protect your health and wellness after age 4.  Screening and testing are the best way to find a health problem early and help you avoid having a fall. Early diagnosis and treatment give you the best chance for managing medical conditions that are more common for people who are older than age 62.  Falls are a major cause of broken bones and head injuries in people who are older than age 56. Take precautions to prevent a fall at home.  Work with your health care provider to learn what changes you can make to improve your health and wellness and to prevent falls. This information is not intended to replace advice given to you by your health care provider. Make sure you discuss any questions you have with your health care provider. Document Revised: 01/13/2019 Document Reviewed: 08/05/2017 Elsevier Patient Education  [employer].  If you have lab work done today you will be contacted with your lab results within the next 2 weeks.  If you have not heard from [place] then please contact us. The fastest way to get your results is to register for My Chart.   IF you received an x-ray today, you will receive an invoice from [facility]. Please contact [facility] at [phone] with questions or concerns regarding your invoice.   IF you received labwork today, you will receive an invoice from [facility]. Please contact [facility] at [phone] with questions or concerns regarding your invoice.   Our  billing staff will not be able to assist you with questions regarding bills from these companies.  You will be contacted with the lab results as soon as they are available. The fastest way to get your results is to activate your My Chart account. Instructions are located on the last page of this paperwork. If you have not heard from [place] regarding the results in 2 weeks, please contact this office.

## 2020-09-17 NOTE — Progress Notes (Signed)
12/13/20211:50 PM  Jedrek Dinovo 1946/08/05, 74 y.o., male [id number]    HPI:   Patient is a 74 y.o. male with past medical history significant for HLD and HTN who presents today for follow-up.   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 Secondary tinnitus Plan: recommendation of hearing aids and he will follow-up with our audiologist concerning obtaining hearing aids. Concerning the tinnitus discussed with him concerning using masking noise when the tinnitus is bad.  Audiologist will review with him concerning tinnitus.  Has hearing aids and is doing better than ever   HTN and HLD Lisinopril 10 mg (started 07/17/19) Atorvastatin 20 mg BP not at goal <130/80  BP Readings from Last 3 Encounters:  09/17/20 (!) 145/93  07/16/20 (!) 160/105  02/10/20 122/84   Lab Results  Component Value Date   CHOL 171 07/16/2020   HDL 31 (L) 07/16/2020   LDLCALC 109 (H) 07/16/2020   TRIG 173 (H) 07/16/2020   CHOLHDL 5.5 (H) 07/16/2020   The 10-year ASCVD risk score Denman George DC Jr., et al., 2013) is: 32%   Values used to calculate the score:     Age: 22 years     Sex: Male     Is Non-Hispanic African American: No     Diabetic: No     Tobacco smoker: No     Systolic Blood Pressure: 145 mmHg     Is BP treated: Yes     HDL Cholesterol: 31 mg/dL     Total Cholesterol: 171 mg/dL  Screenings Colonoscopy: Referral placed PSA: done this visit Not a smoker Tetanus: needs at pharmacy Hep C: this visit  Immunization History  Administered Date(s) Administered  . Influenza,inj,Quad PF,6+ Mos 07/02/2018  . Influenza-Unspecified 07/12/2020  . PFIZER SARS-COV-2 Vaccination 06/21/2020, 07/12/2020  . Pneumococcal Conjugate-13 11/08/2018  . Pneumococcal Polysaccharide-23 02/10/2020  . Zoster Recombinat (Shingrix) 11/05/2018, 07/16/2020      Depression screen [facility] 2/9 09/17/2020 07/16/2020 02/10/2020  Decreased Interest 0 0 0  Down, Depressed,  Hopeless 0 0 0  PHQ - 2 Score 0 0 0    Fall Risk  09/17/2020 07/16/2020 02/10/2020 11/08/2018 04/14/2016  Falls in the past year? 0 0 1 0 No  Number falls in past yr: 0 0 0 0 -  Injury with Fall? 0 0 0 0 -  Follow up Falls evaluation completed Falls evaluation completed Falls evaluation completed - -     No Known Allergies  Prior to Admission medications   Medication Sig Start Date End Date Taking? Authorizing Provider  atorvastatin (LIPITOR) 20 MG tablet Take 1 tablet (20 mg total) by mouth daily. 07/17/20   Amily Depp, Azalee Course, FNP  lisinopril (ZESTRIL) 10 MG tablet Take 1 tablet (10 mg total) by mouth daily. 07/16/20   Khris Jansson, Azalee Course, FNP    History reviewed. No pertinent past medical history.  Past Surgical History:  Procedure Laterality Date  . APPENDECTOMY    . TONSILLECTOMY     at age 83    Social History   Tobacco Use  . Smoking status: Never Smoker  . Smokeless tobacco: Never Used  Substance Use Topics  . Alcohol use: Not Currently    Alcohol/week: 0.0 standard drinks    History reviewed. No pertinent family history.  Review of Systems  Constitutional: Negative for chills, fever, malaise/fatigue and weight loss.  HENT: Positive for hearing loss (hearing aids working well) and tinnitus (improved with hearing aids). Negative for ear discharge and ear pain.  Eyes: Negative for blurred vision, double vision and pain.  Respiratory: Negative for cough, shortness of breath and wheezing.   Cardiovascular: Negative for chest pain, palpitations and leg swelling.  Gastrointestinal: Negative for abdominal pain, blood in stool, constipation, diarrhea, heartburn, nausea and vomiting.  Genitourinary: Negative for dysuria, frequency and hematuria.  Musculoskeletal: Positive for joint pain (Left knee, injections done by ortho). Negative for back pain.  Neurological: Negative for dizziness, tingling, sensory change, weakness and headaches.  Endo/Heme/Allergies: Does not bruise/bleed  easily.  Psychiatric/Behavioral: Negative for suicidal ideas.     OBJECTIVE:  Today's Vitals   09/17/20 1304  BP: (!) 145/93  Pulse: 79  Temp: 98.5 F (36.9 C)  SpO2: 96%  Weight: 213 lb (96.6 kg)  Height: 5\' 9"  (1.753 m)   Body mass index is 31.45 kg/m. Wt Readings from Last 3 Encounters:  09/17/20 213 lb (96.6 kg)  07/16/20 213 lb (96.6 kg)  02/10/20 218 lb (98.9 kg)    Physical Exam Constitutional:      General: He is not in acute distress.    Appearance: Normal appearance. He is normal weight. He is not ill-appearing.  HENT:     Head: Normocephalic.     Right Ear: Tympanic membrane, ear canal and external ear normal. There is no impacted cerumen.     Left Ear: Tympanic membrane, ear canal and external ear normal. There is no impacted cerumen.     Nose: Nose normal.     Mouth/Throat:     Mouth: Mucous membranes are moist.     Dentition: Normal dentition.     Pharynx: Oropharynx is clear. No pharyngeal swelling, oropharyngeal exudate or posterior oropharyngeal erythema.  Eyes:     Extraocular Movements: Extraocular movements intact.     Conjunctiva/sclera: Conjunctivae normal.     Pupils: Pupils are equal, round, and reactive to light.  Neck:     Thyroid: No thyroid mass, thyromegaly or thyroid tenderness.  Cardiovascular:     Rate and Rhythm: Normal rate and regular rhythm.     Pulses: Normal pulses.     Heart sounds: Normal heart sounds. No murmur heard. No friction rub. No gallop.   Pulmonary:     Effort: Pulmonary effort is normal. No respiratory distress.     Breath sounds: Normal breath sounds.  Abdominal:     General: Bowel sounds are normal.     Palpations: Abdomen is soft.     Tenderness: There is no abdominal tenderness.     Hernia: No hernia is present. There is no hernia in the left inguinal area or right inguinal area.  Genitourinary:    Pubic Area: No rash.      Penis: Normal.      Testes: Normal.  Musculoskeletal:        General: No  tenderness, deformity or signs of injury. Normal range of motion.     Cervical back: Normal range of motion.  Lymphadenopathy:     Cervical: No cervical adenopathy.  Skin:    General: Skin is warm and dry.  Neurological:     Mental Status: He is alert and oriented to person, place, and time.  Psychiatric:        Mood and Affect: Mood normal.        Behavior: Behavior normal.     No results found for this or any previous visit (from the past 24 hour(s)).  No results found.   ASSESSMENT and PLAN  Problem List Items Addressed This Visit  Cardiovascular and Mediastinum   Essential hypertension - Primary   Relevant Medications   hydrochlorothiazide (HYDRODIURIL) 12.5 MG tablet Continue Lisinopril 10mg  and Atorvastatin 20mg  Will follow up next visit with labs    Other Visit Diagnoses    Encounter for hepatitis C screening test for low risk patient       Relevant Orders   Hepatitis C antibody   Special screening for malignant neoplasms, colon       Relevant Orders   Ambulatory referral to Gastroenterology   Screening for prostate cancer       Relevant Orders   PSA   Encounter for vitamin deficiency screening       Relevant Orders   Vitamin D, 25-hydroxy   Screening for thyroid disorder       Relevant Orders   TSH   BMI 31.0-31.9,adult    Educated on LFM  Discussed Pre-diabetes and HLD   Need for vaccination     Encouraged to get Tdap from pharmacy   Chronic pain of left knee     Managed by Ortho      Return in about 3 months (around 12/16/2020).   06-21-1967 Estle Sabella, FNP-BC [facility] at [facility] [address] [place], [address] [place] Ph.  [phone] Fax [phone]

## 2020-09-18 LAB — TSH: TSH: 1.61 u[IU]/mL (ref 0.450–4.500)

## 2020-09-18 LAB — PSA: Prostate Specific Ag, Serum: 1.5 ng/mL (ref 0.0–4.0)

## 2020-09-18 LAB — VITAMIN D 25 HYDROXY (VIT D DEFICIENCY, FRACTURES): Vit D, 25-Hydroxy: 16.1 ng/mL — ABNORMAL LOW (ref 30.0–100.0)

## 2020-09-18 LAB — HEPATITIS C ANTIBODY: Hep C Virus Ab: <0.1 s/co ratio (ref 0.0–0.9)

## 2020-09-18 NOTE — Progress Notes (Signed)
If you could let Steve Buchanan know overall his labs look good. His vitamin D was low I would recommend a daily over the counter 2000 IU of Vitamin d3 daily taken with food.

## 2020-11-26 ENCOUNTER — Other Ambulatory Visit: Payer: Self-pay

## 2020-11-26 ENCOUNTER — Ambulatory Visit (INDEPENDENT_AMBULATORY_CARE_PROVIDER_SITE_OTHER): Payer: Medicare Other | Admitting: Family Medicine

## 2020-11-26 ENCOUNTER — Telehealth: Payer: Self-pay

## 2020-11-26 DIAGNOSIS — M1712 Unilateral primary osteoarthritis, left knee: Secondary | ICD-10-CM | POA: Diagnosis not present

## 2020-11-26 DIAGNOSIS — M1711 Unilateral primary osteoarthritis, right knee: Secondary | ICD-10-CM | POA: Diagnosis not present

## 2020-11-26 NOTE — Telephone Encounter (Signed)
Approved for Synvisc one-Bilateral knee Buy and bill Dr. Prince Rome $35 copay 20% OOP No prior auth required

## 2020-11-26 NOTE — Progress Notes (Signed)
   Office Visit Note   Patient: Steve Buchanan           Date of Birth: 27-Apr-1946           MRN: [id number] Visit Date: 11/26/2020 Requested by: Tobie Poet, FNP [address] [place],  [place] [address] PCP: Just, Azalee Course, FNP  Subjective: Chief Complaint  Patient presents with  . Right Knee - Pain  . Left Knee - Pain    Pain bilateral knees. Would like to have visco supplementation today in both knees. Supposed to go to [place] in May and will be walking around a lot. Would like to feel better before then. The left knee still hurts worse than the right, but both are very achy.    HPI: He is here with bilateral knee pain.  In May I gave him a Synvisc 1 injection for the left knee.  He did very well until a month or 2 ago.  Both knees are bothering him about equally.  Prior x-rays showed moderate to severe medial compartment and patellofemoral DJD in both knees.  No mechanical symptoms.  He will be going to [place] in May to watch a baseball game and do other activities.                ROS:   All other systems were reviewed and are negative.  Objective: Vital Signs: There were no vitals taken for this visit.  Physical Exam:  General:  Alert and oriented, in no acute distress. Pulm:  Breathing unlabored. Psy:  Normal mood, congruent affect.  Knees: He has 1+ effusion bilaterally with no warmth or erythema.  Both knees have 2+ patellofemoral crepitus.  He has varus deformity of both knees and some medial joint line tenderness.    Imaging: Prior x-rays reviewed on computer showing bilateral moderate to severe medial compartment joint space narrowing and periarticular spurring, with patellofemoral spurring as well.    Assessment & Plan: 1.  Bilateral knee osteoarthritis -Elected to inject each knee with Synvisc 1 today.  We can repeat these in 6 months if needed.  Prior to his [place] trip, we could inject with cortisone if necessary.     Procedures: Bilateral knee  injections: After sterile prep with Betadine, injected 3 cc 0.25% bupivacaine and Synvisc 1 from superolateral approach, a flash of clear yellow synovial fluid was obtained prior to each injection.    PMFS History: Patient Active Problem List   Diagnosis Date Noted  . Essential hypertension 07/17/2020  . Hyperlipidemia 07/17/2020  . Tinnitus 07/17/2020   No past medical history on file.  No family history on file.  Past Surgical History:  Procedure Laterality Date  . APPENDECTOMY    . TONSILLECTOMY     at age 39   Social History   Occupational History  . Not on file  Tobacco Use  . Smoking status: Never Smoker  . Smokeless tobacco: Never Used  Substance and Sexual Activity  . Alcohol use: Not Currently    Alcohol/week: 0.0 standard drinks  . Drug use: Not Currently  . Sexual activity: Not Currently

## 2020-11-26 NOTE — Telephone Encounter (Signed)
Submitted for VOB for Synvisc one

## 2020-12-13 ENCOUNTER — Other Ambulatory Visit: Payer: Self-pay

## 2020-12-13 ENCOUNTER — Ambulatory Visit (INDEPENDENT_AMBULATORY_CARE_PROVIDER_SITE_OTHER): Payer: Medicare Other | Admitting: Family Medicine

## 2020-12-13 ENCOUNTER — Encounter: Payer: Self-pay | Admitting: Family Medicine

## 2020-12-13 VITALS — BP 143/82 | HR 79 | Temp 98.2°F | Ht 69.0 in | Wt 223.0 lb

## 2020-12-13 DIAGNOSIS — I10 Essential (primary) hypertension: Secondary | ICD-10-CM

## 2020-12-13 DIAGNOSIS — Z1211 Encounter for screening for malignant neoplasm of colon: Secondary | ICD-10-CM

## 2020-12-13 DIAGNOSIS — E559 Vitamin D deficiency, unspecified: Secondary | ICD-10-CM

## 2020-12-13 DIAGNOSIS — E7849 Other hyperlipidemia: Secondary | ICD-10-CM

## 2020-12-13 MED ORDER — HYDROCHLOROTHIAZIDE 25 MG PO TABS: 12.5000 mg | ORAL_TABLET | Freq: Every day | ORAL | 3 refills | Status: DC

## 2020-12-13 MED ORDER — LISINOPRIL 20 MG PO TABS: 10.0000 mg | ORAL_TABLET | Freq: Every day | ORAL | 3 refills | Status: DC

## 2020-12-13 MED ORDER — ATORVASTATIN CALCIUM 20 MG PO TABS: 20.0000 mg | ORAL_TABLET | Freq: Every day | ORAL | 3 refills | Status: DC

## 2020-12-13 NOTE — Patient Instructions (Addendum)
  Health Maintenance After Age 73 After age 73, you are at a higher risk for certain long-term diseases and infections as well as injuries from falls. Falls are a major cause of broken bones and head injuries in people who are older than age 73. Getting regular preventive care can help to keep you healthy and well. Preventive care includes getting regular testing and making lifestyle changes as recommended by your health care provider. Talk with your health care provider about:  Which screenings and tests you should have. A screening is a test that checks for a disease when you have no symptoms.  A diet and exercise plan that is right for you. What should I know about screenings and tests to prevent falls? Screening and testing are the best ways to find a health problem early. Early diagnosis and treatment give you the best chance of managing medical conditions that are common after age 73. Certain conditions and lifestyle choices may make you more likely to have a fall. Your health care provider may recommend:  Regular vision checks. Poor vision and conditions such as cataracts can make you more likely to have a fall. If you wear glasses, make sure to get your prescription updated if your vision changes.  Medicine review. Work with your health care provider to regularly review all of the medicines you are taking, including over-the-counter medicines. Ask your health care provider about any side effects that may make you more likely to have a fall. Tell your health care provider if any medicines that you take make you feel dizzy or sleepy.  Osteoporosis screening. Osteoporosis is a condition that causes the bones to get weaker. This can make the bones weak and cause them to break more easily.  Blood pressure screening. Blood pressure changes and medicines to control blood pressure can make you feel dizzy.  Strength and balance checks. Your health care provider may recommend certain tests to  check your strength and balance while standing, walking, or changing positions.  Foot health exam. Foot pain and numbness, as well as not wearing proper footwear, can make you more likely to have a fall.  Depression screening. You may be more likely to have a fall if you have a fear of falling, feel emotionally low, or feel unable to do activities that you used to do.  Alcohol use screening. Using too much alcohol can affect your balance and may make you more likely to have a fall. What actions can I take to lower my risk of falls? General instructions  Talk with your health care provider about your risks for falling. Tell your health care provider if: ? You fall. Be sure to tell your health care provider about all falls, even ones that seem minor. ? You feel dizzy, sleepy, or off-balance.  Take over-the-counter and prescription medicines only as told by your health care provider. These include any supplements.  Eat a healthy diet and maintain a healthy weight. A healthy diet includes low-fat dairy products, low-fat (lean) meats, and fiber from whole grains, beans, and lots of fruits and vegetables. Home safety  Remove any tripping hazards, such as rugs, cords, and clutter.  Install safety equipment such as grab bars in bathrooms and safety rails on stairs.  Keep rooms and walkways well-lit. Activity  Follow a regular exercise program to stay fit. This will help you maintain your balance. Ask your health care provider what types of exercise are appropriate for you.  If you need a cane   or walker, use it as recommended by your health care provider.  Wear supportive shoes that have nonskid soles.   Lifestyle  Do not drink alcohol if your health care provider tells you not to drink.  If you drink alcohol, limit how much you have: ? 0-1 drink a day for women. ? 0-2 drinks a day for men.  Be aware of how much alcohol is in your drink. In the [place] one drink equals one typical bottle  of beer (12 oz), one-half glass of wine (5 oz), or one shot of hard liquor (1 oz).  Do not use any products that contain nicotine or tobacco, such as cigarettes and e-cigarettes. If you need help quitting, ask your health care provider. Summary  Having a healthy lifestyle and getting preventive care can help to protect your health and wellness after age 73.  Screening and testing are the best way to find a health problem early and help you avoid having a fall. Early diagnosis and treatment give you the best chance for managing medical conditions that are more common for people who are older than age 73.  Falls are a major cause of broken bones and head injuries in people who are older than age 73. Take precautions to prevent a fall at home.  Work with your health care provider to learn what changes you can make to improve your health and wellness and to prevent falls. This information is not intended to replace advice given to you by your health care provider. Make sure you discuss any questions you have with your health care provider. Document Revised: 01/13/2019 Document Reviewed: 08/05/2017 Elsevier Patient Education  2021 Elsevier Inc.   If you have lab work done today you will be contacted with your lab results within the next 2 weeks.  If you have not heard from us then please contact us. The fastest way to get your results is to register for My Chart.   IF you received an x-ray today, you will receive an invoice from [facility]. Please contact [facility] at [phone] with questions or concerns regarding your invoice.   IF you received labwork today, you will receive an invoice from [facility]. Please contact [facility] at [phone] with questions or concerns regarding your invoice.   Our billing staff will not be able to assist you with questions regarding bills from these companies.  You will be contacted with the lab results as soon as they are available.  The fastest way to get your results is to activate your My Chart account. Instructions are located on the last page of this paperwork. If you have not heard from us regarding the results in 2 weeks, please contact this office.      

## 2020-12-13 NOTE — Progress Notes (Signed)
3/10/20222:28 PM  Steve Buchanan 04-Jun-1946, 75 y.o., male [id number]    HPI:   Patient is a 75 y.o. male with past medical history significant for HLD and HTN who presents today for follow-up.  Has hearing aids now Hearing is much improved Still has minor tinnitus Continues to have intermittent knee pain 2.5 weeks ago got shots in bilateral knees with Ortho Plans to follow up since has not had the success he did with past injections Has gained weight over the past couple months   HTN and HLD Lisinopril 10 mg  Started HCTZ 12.5 last OV BP not at goal <130/80 BP Readings from Last 3 Encounters:  12/13/20 (!) 143/82  09/17/20 (!) 145/93  07/16/20 (!) 160/105   HLD Atorvastatin 32m Lab Results  Component Value Date   CHOL 171 07/16/2020   HDL 31 (L) 07/16/2020   LDLCALC 109 (H) 07/16/2020   TRIG 173 (H) 07/16/2020   CHOLHDL 5.5 (H) 07/16/2020   The 10-year ASCVD risk score (Mikey BussingDC Jr., et al., 2013) is: 31.4%   Values used to calculate the score:     Age: [age]years     Sex: Male     Is Non-Hispanic African American: No     Diabetic: No     Tobacco smoker: No     Systolic Blood Pressure: 1579mmHg     Is BP treated: Yes     HDL Cholesterol: 31 mg/dL     Total Cholesterol: 171 mg/dL   Vitamin D deficiency Takes OTC Vitamin D daily Last vitamin D Lab Results  Component Value Date   VD25OH 16.1 (L) 09/17/2020     Screenings Colonoscopy: Referral placed PSA: 1.5 on 12/13 Not a smoker [place]  . TETANUS/TDAP  Never done  . COLONOSCOPY (Pts 45-441yrInsurance coverage will need to be confirmed)  Never done  . COVID-19 Vaccine (3 - Booster for Pfizer series) 01/10/2021  . INFLUENZA VACCINE  Completed  . Hepatitis C Screening  Completed  . PNA vac Low Risk Adult  Completed  . HPV VACCINES  Aged Out     Immunization History  Administered Date(s) Administered  . Influenza,inj,Quad PF,6+ Mos  07/02/2018  . Influenza-Unspecified 07/12/2020  . PFIZER(Purple Top)SARS-COV-2 Vaccination 06/21/2020, 07/12/2020  . Pneumococcal Conjugate-13 11/08/2018  . Pneumococcal Polysaccharide-23 02/10/2020  . Zoster Recombinat (Shingrix) 11/05/2018, 07/16/2020      [facility] 12/13/2020 09/17/2020 07/16/2020  Decreased Interest 0 0 0  Down, Depressed, Hopeless 0 0 0  PHQ - 2 Score 0 0 0    Fall Risk  12/13/2020 09/17/2020 07/16/2020 02/10/2020 11/08/2018  Falls in the past year? 0 0 0 1 0  Number falls in past yr: 0 0 0 0 0  Injury with Fall? 0 0 0 0 0  Follow up Falls evaluation completed Falls evaluation completed Falls evaluation completed Falls evaluation completed -     No Known Allergies  Prior to Admission medications   Medication Sig Start Date End Date Taking? Authorizing Provider  atorvastatin (LIPITOR) 20 MG tablet Take 1 tablet (20 mg total) by mouth daily. 07/17/20   [place] Perry, KeLaurita QuintFNP  lisinopril (ZESTRIL) 10 MG tablet Take 1 tablet (10 mg total) by mouth daily. 07/16/20   Nicholad Kautzman, KeLaurita QuintFNP    History reviewed. No pertinent past medical history.  Past Surgical History:  Procedure Laterality Date  . APPENDECTOMY    . TONSILLECTOMY  at age 31    Social History   Tobacco Use  . Smoking status: Never Smoker  . Smokeless tobacco: Never Used  Substance Use Topics  . Alcohol use: Not Currently    Alcohol/week: 0.0 standard drinks    History reviewed. No pertinent family history.  Review of Systems  Constitutional: Negative for chills, fever, malaise/fatigue and weight loss.  HENT: Positive for hearing loss (hearing aids working well) and tinnitus (improved with hearing aids). Negative for ear discharge and ear pain.   Eyes: Negative for blurred vision, double vision and pain.  Respiratory: Negative for cough, shortness of breath and wheezing.   Cardiovascular: Negative for chest pain, palpitations and leg swelling.  Gastrointestinal: Negative  for abdominal pain, blood in stool, constipation, diarrhea, heartburn, nausea and vomiting.  Genitourinary: Negative for dysuria, frequency and hematuria.  Musculoskeletal: Positive for joint pain (bilateral knees, injections done by ortho in the past). Negative for back pain.  Neurological: Negative for dizziness, tingling, sensory change, weakness and headaches.  Endo/Heme/Allergies: Does not bruise/bleed easily.  Psychiatric/Behavioral: Negative for suicidal ideas.     OBJECTIVE:  Today's Vitals   12/13/20 1407  BP: (!) 143/82  Pulse: 79  Temp: 98.2 F (36.8 C)  SpO2: 96%  Weight: 223 lb (101.2 kg)  Height: $Remove'5\' 9"'PKvGXUe$  (1.753 m)   Body mass index is 32.93 kg/m. Wt Readings from Last 3 Encounters:  12/13/20 223 lb (101.2 kg)  09/17/20 213 lb (96.6 kg)  07/16/20 213 lb (96.6 kg)    Physical Exam Constitutional:      General: He is not in acute distress.    Appearance: Normal appearance. He is normal weight. He is not ill-appearing.  HENT:     Head: Normocephalic.     Mouth/Throat:     Dentition: Normal dentition.     Pharynx: No pharyngeal swelling.  Cardiovascular:     Rate and Rhythm: Normal rate and regular rhythm.     Pulses: Normal pulses.     Heart sounds: Normal heart sounds. No murmur heard. No friction rub. No gallop.   Pulmonary:     Effort: Pulmonary effort is normal. No respiratory distress.     Breath sounds: Normal breath sounds.  Abdominal:     General: Bowel sounds are normal.     Palpations: Abdomen is soft.     Tenderness: There is no abdominal tenderness.     Hernia: No hernia is present.  Musculoskeletal:        General: No tenderness, deformity or signs of injury. Normal range of motion.     Cervical back: Normal range of motion.     Right knee: Normal.     Left knee: Normal.  Skin:    General: Skin is warm and dry.  Neurological:     Mental Status: He is alert and oriented to person, place, and time.  Psychiatric:        Mood and Affect:  Mood normal.        Behavior: Behavior normal.     No results found for this or any previous visit (from the past 24 hour(s)).  No results found.   ASSESSMENT and PLAN  Problem List Items Addressed This Visit      Cardiovascular and Mediastinum   Essential hypertension   Relevant Medications   lisinopril (ZESTRIL) 20 MG tablet   atorvastatin (LIPITOR) 20 MG tablet   hydrochlorothiazide (HYDRODIURIL) 25 MG tablet   Other Relevant Orders   CMP14+EGFR     Other  Hyperlipidemia - Primary   Relevant Medications   lisinopril (ZESTRIL) 20 MG tablet   atorvastatin (LIPITOR) 20 MG tablet   hydrochlorothiazide (HYDRODIURIL) 25 MG tablet   Other Relevant Orders   Lipid Panel   Hemoglobin A1c    Other Visit Diagnoses    Special screening for malignant neoplasms, colon       Relevant Orders   Ambulatory referral to Gastroenterology   Vitamin D deficiency       Relevant Orders   Vitamin D, 25-hydroxy         Plan  Increase lisinopril from 10 to 20 mg  Pending potassium level increase HCTZ  Will follow up with lab results  Order placed for colonoscopy  Discussed LFM for weight loss  Encouraged to get Tdap at pharmacy   Return in about 3 months (around 03/15/2021).   Huston Foley Zema Lizardo, FNP-BC [facility] [place], [place] [address] Ph.  [phone] Fax [phone]

## 2020-12-13 NOTE — Addendum Note (Signed)
Addended by: Alm Bustard R on: 12/13/2020 02:32 PM   Modules accepted: Orders

## 2020-12-14 ENCOUNTER — Ambulatory Visit: Payer: Medicare Other

## 2020-12-14 LAB — CMP14+EGFR
ALT: 21 IU/L (ref 0–44)
AST: 17 IU/L (ref 0–40)
Albumin/Globulin Ratio: 2.2 (ref 1.2–2.2)
Albumin: 4.7 g/dL (ref 3.7–4.7)
Alkaline Phosphatase: 63 IU/L (ref 44–121)
BUN/Creatinine Ratio: 26 — ABNORMAL HIGH (ref 10–24)
BUN: 18 mg/dL (ref 8–27)
Bilirubin Total: 0.4 mg/dL (ref 0.0–1.2)
CO2: 22 mmol/L (ref 20–29)
Calcium: 9.9 mg/dL (ref 8.6–10.2)
Chloride: 100 mmol/L (ref 96–106)
Creatinine, Ser: 0.69 mg/dL — ABNORMAL LOW (ref 0.76–1.27)
Globulin, Total: 2.1 g/dL (ref 1.5–4.5)
Glucose: 120 mg/dL — ABNORMAL HIGH (ref 65–99)
Potassium: 4 mmol/L (ref 3.5–5.2)
Sodium: 142 mmol/L (ref 134–144)
Total Protein: 6.8 g/dL (ref 6.0–8.5)
eGFR: 98 mL/min/{1.73_m2} (ref >59)

## 2020-12-14 LAB — HEMOGLOBIN A1C
Est. average glucose Bld gHb Est-mCnc: 123 mg/dL
Hgb A1c MFr Bld: 5.9 % — ABNORMAL HIGH (ref 4.8–5.6)

## 2020-12-14 LAB — LIPID PANEL
Chol/HDL Ratio: 4.9 ratio (ref 0.0–5.0)
Cholesterol, Total: 156 mg/dL (ref 100–199)
HDL: 32 mg/dL — ABNORMAL LOW (ref >39)
LDL Chol Calc (NIH): 60 mg/dL (ref 0–99)
Triglycerides: 418 mg/dL — ABNORMAL HIGH (ref 0–149)
VLDL Cholesterol Cal: 64 mg/dL — ABNORMAL HIGH (ref 5–40)

## 2020-12-14 LAB — VITAMIN D 25 HYDROXY (VIT D DEFICIENCY, FRACTURES): Vit D, 25-Hydroxy: 26.7 ng/mL — ABNORMAL LOW (ref 30.0–100.0)

## 2020-12-15 NOTE — Progress Notes (Signed)
Overall your labs look good. Your Vitamin D did come back quite low. I would recommend taking 2000 IU of Over the counter Vitamin D3 with food daily. Your cholesterol levels are stable no changes needed to your medications.. Your A1c is elevated placing you in the pre-diabetes range. No medications are needed at this time, but continue to work on improving your diet and increasing exercise as able. Let me know if you would like a referral to a dietician to further discuss this.

## 2021-02-18 ENCOUNTER — Telehealth: Payer: Self-pay | Admitting: Family Medicine

## 2021-02-18 NOTE — Telephone Encounter (Signed)
Patient scheduled for cortisone injection beginning of June

## 2021-02-18 NOTE — Telephone Encounter (Signed)
Patient called requesting a call back from Dr. Prince Rome. Patient states it is concerning an appt for cortisone injection. Please call patient back at [phone].

## 2021-03-06 ENCOUNTER — Ambulatory Visit: Payer: Self-pay

## 2021-03-06 ENCOUNTER — Encounter: Payer: Self-pay | Admitting: Family Medicine

## 2021-03-06 ENCOUNTER — Other Ambulatory Visit: Payer: Self-pay

## 2021-03-06 ENCOUNTER — Ambulatory Visit (INDEPENDENT_AMBULATORY_CARE_PROVIDER_SITE_OTHER): Payer: Medicare Other | Admitting: Family Medicine

## 2021-03-06 DIAGNOSIS — M1711 Unilateral primary osteoarthritis, right knee: Secondary | ICD-10-CM

## 2021-03-06 DIAGNOSIS — M1712 Unilateral primary osteoarthritis, left knee: Secondary | ICD-10-CM | POA: Diagnosis not present

## 2021-03-06 NOTE — Progress Notes (Signed)
   Office Visit Note   Patient: Steve Buchanan           Date of Birth: July 01, 1946           MRN: [id number] Visit Date: 03/06/2021 Requested by: No referring provider defined for this encounter. PCP: Just, Azalee Course, FNP (Inactive)  Subjective: Chief Complaint  Patient presents with  . Right Knee - Pain    The visco supplementation injections in both knees in February did not work as well this last time. The knees hurt intermittently, left more than right. Going to the beach in 2 days - requesting cortisone injections.  . Left Knee - Pain    HPI: He is here with bilateral knee pain.  Gel injections in February did not give as much relief as the ones before which were done under ultrasound guidance.  He is getting ready to go to the beach and would like cortisone injections today.              ROS:   All other systems were reviewed and are negative.  Objective: Vital Signs: There were no vitals taken for this visit.  Physical Exam:  General:  Alert and oriented, in no acute distress. Pulm:  Breathing unlabored. Psy:  Normal mood, congruent affect.  Knees: Trace effusion bilaterally with no warmth or erythema.  Imaging: No results found.  Assessment & Plan: 1.  Bilateral knee DJD -Steroid injections done today under ultrasound guidance from superolateral approach into the joint recess.  We will see him back as needed for gel injections, doing them under ultrasound guidance as well.     Procedures: After sterile prep with Betadine injected 3 cc 1% lidocaine without epinephrine and 6 mg betamethasone into each knee.       PMFS History: Patient Active Problem List   Diagnosis Date Noted  . Essential hypertension 07/17/2020  . Hyperlipidemia 07/17/2020  . Tinnitus 07/17/2020   History reviewed. No pertinent past medical history.  History reviewed. No pertinent family history.  Past Surgical History:  Procedure Laterality Date  . APPENDECTOMY    . TONSILLECTOMY      at age 65   Social History   Occupational History  . Not on file  Tobacco Use  . Smoking status: Never Smoker  . Smokeless tobacco: Never Used  Substance and Sexual Activity  . Alcohol use: Not Currently    Alcohol/week: 0.0 standard drinks  . Drug use: Not Currently  . Sexual activity: Not Currently

## 2021-08-15 ENCOUNTER — Telehealth: Payer: Self-pay | Admitting: Orthopaedic Surgery

## 2021-08-15 NOTE — Telephone Encounter (Signed)
Pt. Called requesting a call concerning gel injections. Past Dr. Prince Rome pt. Please call pt concerning gel injection. Pt states he was told he cant get injection until Dec. Please call pt at [phone]

## 2021-08-15 NOTE — Telephone Encounter (Signed)
Talked with patient about gel injections.  Submitted for SynviscOne, bilateral knee. BV pending.

## 2021-08-16 ENCOUNTER — Telehealth: Payer: Self-pay

## 2021-08-16 NOTE — Telephone Encounter (Signed)
PA required for SynviscOne, bilateral knee. Submitted PA online through [employer] Pending PA# [id number]

## 2021-08-19 ENCOUNTER — Telehealth: Payer: Self-pay

## 2021-08-19 NOTE — Telephone Encounter (Signed)
Approved for SynviscOne, bilateral knee. Buy & Bill Patient will be responsible for 20% OOP. Co-pay of $35.00 No PA required per patient's insurance Reference# [id number]

## 2021-08-28 ENCOUNTER — Encounter: Payer: Self-pay | Admitting: Physician Assistant

## 2021-08-28 ENCOUNTER — Ambulatory Visit: Payer: Medicare Other | Admitting: Physician Assistant

## 2021-08-28 DIAGNOSIS — M1712 Unilateral primary osteoarthritis, left knee: Secondary | ICD-10-CM

## 2021-08-28 DIAGNOSIS — M1711 Unilateral primary osteoarthritis, right knee: Secondary | ICD-10-CM | POA: Diagnosis not present

## 2021-08-28 MED ORDER — LIDOCAINE HCL 1 % IJ SOLN
3.0000 mL | INTRAMUSCULAR | Status: AC | PRN
  Administered 2021-08-28: 3 mL

## 2021-08-28 MED ORDER — HYLAN G-F 20 48 MG/6ML IX SOSY
48.0000 mg | PREFILLED_SYRINGE | INTRA_ARTICULAR | Status: AC | PRN
  Administered 2021-08-28: 48 mg via INTRA_ARTICULAR

## 2021-08-28 NOTE — Progress Notes (Signed)
   Procedure Note  Patient: Steve Buchanan             Date of Birth: [birth year]           MRN: [id number]             Visit Date: 08/28/2021  HPI: Patient here for visco supplementation both knees. He feels like the injections are helping.  He still has some pain whenever he is playing golf.  Trouble walking he feels like his gait and balance are off.  He has known osteoarthritis both knees.  He has no planned surgery within the next 6 months.  Physical exam: Bilateral knees no abnormal warmth erythema or effusion.  Procedures: Visit Diagnoses:  1. Unilateral primary osteoarthritis, left knee   2. Unilateral primary osteoarthritis, right knee     Large Joint Inj: bilateral knee on 08/28/2021 4:54 PM Indications: pain Details: 22 G 1.5 in needle, superolateral approach  Arthrogram: No  Medications (Right): 3 mL lidocaine 1 %; 48 mg Hylan 48 MG/6ML Medications (Left): 3 mL lidocaine 1 %; 48 mg Hylan 48 MG/6ML Outcome: tolerated well, no immediate complications Procedure, treatment alternatives, risks and benefits explained, specific risks discussed. Consent was given by the patient. Immediately prior to procedure a time out was called to verify the correct patient, procedure, equipment, support staff and site/side marked as required. Patient was prepped and draped in the usual sterile fashion.    Plan: He knows to wait at least 6 months between injections.  He will follow-up with [place] as needed.  Questions were encouraged and answered at length today.

## 2021-09-04 ENCOUNTER — Telehealth: Payer: Self-pay | Admitting: Physician Assistant

## 2021-09-04 NOTE — Telephone Encounter (Signed)
Pt called asking for handicap placard. Pt states his knee are better with gel injections but is in need of handicap placard for short distance walking. Please call pt when ready for pick up. Pt phone number is [phone].

## 2021-09-04 NOTE — Telephone Encounter (Signed)
Is this ok?

## 2021-09-04 NOTE — Telephone Encounter (Signed)
Placed at the front desk. Pt advised

## 2022-02-17 ENCOUNTER — Encounter: Payer: Self-pay | Admitting: Orthopaedic Surgery

## 2022-02-17 ENCOUNTER — Telehealth: Payer: Self-pay

## 2022-02-17 ENCOUNTER — Ambulatory Visit: Payer: Medicare Other | Admitting: Orthopaedic Surgery

## 2022-02-17 VITALS — Ht 68.0 in | Wt 225.6 lb

## 2022-02-17 DIAGNOSIS — M25562 Pain in left knee: Secondary | ICD-10-CM | POA: Diagnosis not present

## 2022-02-17 DIAGNOSIS — G8929 Other chronic pain: Secondary | ICD-10-CM

## 2022-02-17 DIAGNOSIS — M1712 Unilateral primary osteoarthritis, left knee: Secondary | ICD-10-CM | POA: Diagnosis not present

## 2022-02-17 DIAGNOSIS — M1711 Unilateral primary osteoarthritis, right knee: Secondary | ICD-10-CM | POA: Diagnosis not present

## 2022-02-17 DIAGNOSIS — M25561 Pain in right knee: Secondary | ICD-10-CM | POA: Diagnosis not present

## 2022-02-17 NOTE — Progress Notes (Signed)
The patient is well-known to [place].  He is 76 years old and does have moderate arthritis of both his knees with varus malalignment.  This has been well-documented with clinical exam and x-ray findings.  He has tried and failed all forms conservative treatment including steroid injections.  Steroid injections did not help at all.  He has had hyaluronic acid in both knees 6 months ago and that is helped him the most.  He had gained some weight so he is working back on weight loss.  He has been exercising and has been on a treadmill as well.  Both knees do hurt with the left more than the right.  He is wanting to try hyaluronic acid again since that is what helped him the most with both knees. ? ?Both knees are examined today.  There is no effusion with either knee.  Both knees have varus malalignment that is correctable.  Both knees have full range of motion and are ligamentously stable.  There is medial joint line tenderness and some patellofemoral crepitation of both knees. ? ?He is appropriate candidate once again for hyaluronic acid for both knees since that is helped him the most and is lasted so long.  Given the failure of steroid injections, hyaluronic acid is also warranted to treat the pain from osteoarthritis of both knees.  He will continue as needed anti-inflammatories as well as weight loss and quad strengthening activities as well.  Hopefully we can get these injections improved for him and get him back in for these injections when they are approved for hyaluronic acid. ?

## 2022-02-17 NOTE — Telephone Encounter (Signed)
Bilateral knee gel injection  

## 2022-02-18 NOTE — Telephone Encounter (Signed)
Noted  

## 2022-03-11 ENCOUNTER — Other Ambulatory Visit: Payer: Self-pay

## 2022-03-11 ENCOUNTER — Telehealth: Payer: Self-pay | Admitting: Orthopaedic Surgery

## 2022-03-11 NOTE — Telephone Encounter (Signed)
Patient called asked if the gel injection has been approved.  The number to contact patient is [phone]

## 2022-03-11 NOTE — Telephone Encounter (Signed)
Talked with patient concerning gel injection and appt.has been scheduled.

## 2022-03-14 ENCOUNTER — Other Ambulatory Visit: Payer: Self-pay

## 2022-03-14 DIAGNOSIS — M1711 Unilateral primary osteoarthritis, right knee: Secondary | ICD-10-CM

## 2022-03-14 DIAGNOSIS — M1712 Unilateral primary osteoarthritis, left knee: Secondary | ICD-10-CM

## 2022-03-20 ENCOUNTER — Ambulatory Visit: Payer: Medicare Other | Admitting: Physician Assistant

## 2022-03-20 ENCOUNTER — Encounter: Payer: Self-pay | Admitting: Physician Assistant

## 2022-03-20 DIAGNOSIS — M1712 Unilateral primary osteoarthritis, left knee: Secondary | ICD-10-CM

## 2022-03-20 DIAGNOSIS — M1711 Unilateral primary osteoarthritis, right knee: Secondary | ICD-10-CM | POA: Diagnosis not present

## 2022-03-20 DIAGNOSIS — M170 Bilateral primary osteoarthritis of knee: Secondary | ICD-10-CM | POA: Diagnosis not present

## 2022-03-20 MED ORDER — SODIUM HYALURONATE 60 MG/3ML IX PRSY
60.0000 mg | PREFILLED_SYRINGE | INTRA_ARTICULAR | Status: AC | PRN
  Administered 2022-03-20: 60 mg via INTRA_ARTICULAR

## 2022-03-20 MED ORDER — LIDOCAINE HCL 1 % IJ SOLN
3.0000 mL | INTRAMUSCULAR | Status: AC | PRN
  Administered 2022-03-20: 3 mL

## 2022-03-20 NOTE — Progress Notes (Signed)
   Procedure Note  Patient: Steve Buchanan             Date of Birth: 12/18/1945           MRN: [id number]             Visit Date: 03/20/2022 HPI: Steve Buchanan 76 year old male comes in today for scheduled Durolane injections both knees.  He has known osteoarthritis both knees.  He is failed other conservative treatment which is included medications and injections.  However he does state that with the supplemental injections that these make a big difference.  He also has been exercising since his knees are feeling better and feels that walking is definitely helped.  Also notes the warmer weather is helped.  He does have some trouble with uneven ground.  He is gone back to playing golf which he has not been able to do for some years.  He has no scheduled surgery on either knee in the next 6 months.  No new injury to either knee.  Physical exam: Bilateral knees no abnormal warmth erythema.  Slight effusion left knee.  Right knee no effusion.  Full range of motion bilateral knees ligamentously stable. Procedures: Visit Diagnoses:  1. Unilateral primary osteoarthritis, left knee   2. Unilateral primary osteoarthritis, right knee     Large Joint Inj: bilateral knee on 03/20/2022 3:04 PM Indications: pain Details: 22 G 1.5 in needle, superolateral approach  Arthrogram: No  Medications (Right): 60 mg Sodium Hyaluronate 60 MG/3ML Medications (Left): 3 mL lidocaine 1 %; 60 mg Sodium Hyaluronate 60 MG/3ML Aspirate (Left): 10 mL yellow Outcome: tolerated well, no immediate complications Procedure, treatment alternatives, risks and benefits explained, specific risks discussed. Consent was given by the patient. Immediately prior to procedure a time out was called to verify the correct patient, procedure, equipment, support staff and site/side marked as required. Patient was prepped and draped in the usual sterile fashion.     Plan: He knows to wait at least 6 months between injections.  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

## 2022-05-20 ENCOUNTER — Telehealth: Payer: Self-pay | Admitting: Orthopaedic Surgery

## 2022-05-20 NOTE — Telephone Encounter (Signed)
Called pt. Cant have repeat inj until dec 15th. Pt stated understanding to this. Appt made for 12/18. Can you please work on [employer]

## 2022-05-20 NOTE — Telephone Encounter (Signed)
Pt called requesting to submit for gel injection. Please call pt when approved. Pt phone number is [phone].

## 2022-05-20 NOTE — Telephone Encounter (Signed)
Noted. Will submit in November, 2023.

## 2022-09-03 ENCOUNTER — Telehealth: Payer: Self-pay

## 2022-09-03 NOTE — Telephone Encounter (Signed)
VOB submitted for Durolane, bilateral knee.  

## 2022-09-19 ENCOUNTER — Other Ambulatory Visit: Payer: Self-pay

## 2022-09-19 DIAGNOSIS — M1711 Unilateral primary osteoarthritis, right knee: Secondary | ICD-10-CM

## 2022-09-19 DIAGNOSIS — M1712 Unilateral primary osteoarthritis, left knee: Secondary | ICD-10-CM

## 2022-09-22 ENCOUNTER — Encounter: Payer: Self-pay | Admitting: Physician Assistant

## 2022-09-22 ENCOUNTER — Ambulatory Visit: Payer: Medicare Other | Admitting: Physician Assistant

## 2022-09-22 DIAGNOSIS — M170 Bilateral primary osteoarthritis of knee: Secondary | ICD-10-CM

## 2022-09-22 DIAGNOSIS — M1711 Unilateral primary osteoarthritis, right knee: Secondary | ICD-10-CM

## 2022-09-22 DIAGNOSIS — M1712 Unilateral primary osteoarthritis, left knee: Secondary | ICD-10-CM | POA: Diagnosis not present

## 2022-09-22 MED ORDER — SODIUM HYALURONATE 60 MG/3ML IX PRSY
60.0000 mg | PREFILLED_SYRINGE | INTRA_ARTICULAR | Status: AC | PRN
  Administered 2022-09-22: 60 mg via INTRA_ARTICULAR

## 2022-09-22 NOTE — Progress Notes (Signed)
   Procedure Note  Patient: Steve Buchanan             Date of Birth: [birth year]           MRN: [id number]             Visit Date: 09/22/2022  HPI: Mr. Peggs returns today for scheduled Durolane injections both knees.  He states the Durolane injections on 03/20/2022 gave him good relief from the arthritic knees.  He has failed conservative treatment in the past which is included medications and cortisone injections.  Supplemental injections have been most beneficial.  He has no scheduled knee surgery in the next 6 months.  Denies any new injury to either knee.  Notes he has difficulty going up and down stairs and has some stiffness in the morning over the last few months.  Review of systems: See HPI  Physical exam: General well-developed well-nourished male no acute distress ambulates without any assistive device. Bilateral knees good range of motion of both knees.  Tenderness along the medial joint line of the right knee and lateral joint line of the left knee.  Slight effusion left knee no effusion right knee.  No abnormal warmth erythema either knee.  Procedures: Visit Diagnoses:  1. Unilateral primary osteoarthritis, left knee   2. Unilateral primary osteoarthritis, right knee     Large Joint Inj: bilateral knee on 09/22/2022 11:34 AM Indications: pain Details: 22 G 1.5 in needle, superolateral approach  Arthrogram: No  Medications (Right): 60 mg Sodium Hyaluronate 60 MG/3ML Medications (Left): 60 mg Sodium Hyaluronate 60 MG/3ML Aspirate (Left): 5 mL yellow and blood-tinged Outcome: tolerated well, no immediate complications Procedure, treatment alternatives, risks and benefits explained, specific risks discussed. Consent was given by the patient. Immediately prior to procedure a time out was called to verify the correct patient, procedure, equipment, support staff and site/side marked as required. Patient was prepped and draped in the usual sterile fashion.    Plan: He will  follow-up on a as needed.  Questions encouraged and answered at length.  He knows to wait at least 6 months between supplemental injections.

## 2023-03-19 ENCOUNTER — Telehealth: Payer: Self-pay | Admitting: Orthopaedic Surgery

## 2023-03-19 NOTE — Telephone Encounter (Signed)
Patient called in stating he is supposed to be getting a gel injection on 06/20 but I see no notes on it of recently and the appt was made last month please advise last injection was Durolane 09/22/2022 in both knees

## 2023-03-19 NOTE — Telephone Encounter (Signed)
VOB submitted for Durolane, bilateral knee  

## 2023-03-20 ENCOUNTER — Other Ambulatory Visit: Payer: Self-pay

## 2023-03-20 DIAGNOSIS — M1711 Unilateral primary osteoarthritis, right knee: Secondary | ICD-10-CM

## 2023-03-20 DIAGNOSIS — M1712 Unilateral primary osteoarthritis, left knee: Secondary | ICD-10-CM

## 2023-03-26 ENCOUNTER — Ambulatory Visit: Payer: Medicare Other | Admitting: Physician Assistant

## 2023-03-26 ENCOUNTER — Ambulatory Visit: Payer: Medicare Other | Admitting: Orthopaedic Surgery

## 2023-03-26 ENCOUNTER — Encounter: Payer: Self-pay | Admitting: Orthopaedic Surgery

## 2023-03-26 DIAGNOSIS — M1712 Unilateral primary osteoarthritis, left knee: Secondary | ICD-10-CM

## 2023-03-26 DIAGNOSIS — M1711 Unilateral primary osteoarthritis, right knee: Secondary | ICD-10-CM

## 2023-03-26 MED ORDER — SODIUM HYALURONATE 60 MG/3ML IX PRSY
60.0000 mg | PREFILLED_SYRINGE | INTRA_ARTICULAR | Status: AC | PRN
  Administered 2023-03-26: 60 mg via INTRA_ARTICULAR

## 2023-03-26 NOTE — Progress Notes (Signed)
   Procedure Note  Patient: Steve Buchanan             Date of Birth: [birth year]           MRN: [id number]             Visit Date: 03/26/2023  Procedures: Visit Diagnoses:  1. Unilateral primary osteoarthritis, left knee   2. Unilateral primary osteoarthritis, right knee     Large Joint Inj: R knee on 03/26/2023 3:15 PM Indications: diagnostic evaluation and pain Details: 22 G 1.5 in needle, superolateral approach  Arthrogram: No  Medications: 60 mg Sodium Hyaluronate 60 MG/3ML Outcome: tolerated well, no immediate complications Procedure, treatment alternatives, risks and benefits explained, specific risks discussed. Consent was given by the patient. Immediately prior to procedure a time out was called to verify the correct patient, procedure, equipment, support staff and site/side marked as required. Patient was prepped and draped in the usual sterile fashion.    Large Joint Inj: L knee on 03/26/2023 3:15 PM Indications: diagnostic evaluation and pain Details: 22 G 1.5 in needle, superolateral approach  Arthrogram: No  Medications: 60 mg Sodium Hyaluronate 60 MG/3ML Outcome: tolerated well, no immediate complications Procedure, treatment alternatives, risks and benefits explained, specific risks discussed. Consent was given by the patient. Immediately prior to procedure a time out was called to verify the correct patient, procedure, equipment, support staff and site/side marked as required. Patient was prepped and draped in the usual sterile fashion.    The patient is a 77 year old gentleman who comes in for scheduled hyaluronic acid injections in both knees to treat the pain from significant osteoarthritis more on the left knee than the right.  He has had any type of injections before.  He is in no acute changes medical status.  Both knees have varus malalignment.  I did place Durolane in both knees today without difficulty.  He is thinking about maybe having his left hip replaced  in January.  He will let us know.  Injections have thus far worked well in terms of hyaluronic acid injections.  Lot #[id number]

## 2023-03-27 ENCOUNTER — Telehealth: Payer: Self-pay | Admitting: Physician Assistant

## 2023-03-27 NOTE — Telephone Encounter (Signed)
Pt called asking to go ahead and submit for his next 6 month bil gel injection. Informed pt appt can be made in 6 months pending approval. Pt phone number is [phone].

## 2023-03-27 NOTE — Telephone Encounter (Signed)
Talked with patient and advised him of next available gel injection.  Which will be after 09/25/2023 and will be submitted in November, 2024.  Patient voiced that he understands.

## 2023-09-21 ENCOUNTER — Telehealth: Payer: Self-pay | Admitting: Physician Assistant

## 2023-10-08 ENCOUNTER — Ambulatory Visit: Payer: Medicare Other | Admitting: Physician Assistant

## 2023-10-08 ENCOUNTER — Encounter: Payer: Self-pay | Admitting: Physician Assistant

## 2023-10-08 ENCOUNTER — Other Ambulatory Visit (INDEPENDENT_AMBULATORY_CARE_PROVIDER_SITE_OTHER): Payer: Medicare Other

## 2023-10-08 VITALS — Ht 68.11 in | Wt 227.0 lb

## 2023-10-08 DIAGNOSIS — M1712 Unilateral primary osteoarthritis, left knee: Secondary | ICD-10-CM

## 2023-10-08 NOTE — Progress Notes (Signed)
 Office Visit Note   Patient: Steve Buchanan           Date of Birth: [birth year]           MRN: [id number] Visit Date: 10/08/2023              Requested by: No referring provider defined for this encounter. PCP: Just, Kelsea J, FNP (Inactive)   Assessment & Plan: Visit Diagnoses:  1. Unilateral primary osteoarthritis, left knee     Plan:  After discussing patient's medications with him and the fact that he is no longer taking his blood pressure medicines or his cholesterol medicines he has not seen his primary care physician and possibly 5 years recommend he return to his primary care physician.  He will need clearance for surgery.  I did review with him knee arthroplasty risk benefits.  Risk include but are not limited to DVT/PE, nerve vessel injury, wound healing problems, infection, prolonged pain, and blood loss.  Discussed with him in the postoperative protocol.  Knee model shown.  We will fill out a surgical sheet but he will need clearance prior to scheduling him for surgery.  He understands this.  Will see him back 2 weeks after surgery.  Follow-Up Instructions: No follow-ups on file.   Orders:  Orders Placed This Encounter  Procedures   XR Knee 1-2 Views Left   No orders of the defined types were placed in this encounter.     Procedures: No procedures performed   Clinical Data: No additional findings.   Subjective: No chief complaint on file.   HPI Mr. Luna returns today to discuss his left knee arthritis.  He has known end-stage arthritis left knee.  He has tried conservative treatment which is included medications, injections both cortisone and viscosupplementation.  Despite these conservative measures he continues to have severe pain in his left knee that limits his walking.  He states his pain can be 8.5 out of 10 pain with short distances.  He does not use a walking stick when out or outside.  Denies knee gives way.  Pain shoots into the lower legs at  times.  He has had no new injury to the knee.  He would like to undergo left knee replacement in the future.  Review of Systems Denies any fevers, chills, chest pain, orthopnea.  He does state that he has some shortness of breath with exertion but is unsure if this is because from the pain that he experiences due to his knee.  Denies any ongoing infections.  Objective: Vital Signs: Ht 5' 8.11 (1.73 m)   Wt 227 lb (103 kg)   BMI 34.40 kg/m   Physical Exam Constitutional:      Appearance: He is not ill-appearing or diaphoretic.  Pulmonary:     Effort: Pulmonary effort is normal.  Neurological:     Mental Status: He is alert and oriented to person, place, and time.  Psychiatric:        Mood and Affect: Mood normal.     Ortho Exam Bilateral knees: Good range of motion both knees.  Significant patellofemoral crepitus bilaterally.  Varus deformity bilateral knees.  Left knee tenderness along medial joint line.  No instability valgus varus stressing of either knee.  Left knee anterior drawer is negative.  Calves are supple nontender.  Slight edema bilateral lower extremities.  Dorsiflexion plantarflexion bilateral ankles intact. Specialty Comments:  No specialty comments available.  Imaging: XR Knee 1-2 Views Left Result Date: 10/08/2023 Left  knee 2 views shows bone-on-bone medial compartment with varus deformity.  Lateral compartment overall well-preserved.  Severe patellofemoral arthritic changes with anterior and superior pole osteophytes.  No acute fractures or acute findings.    PMFS History: Patient Active Problem List   Diagnosis Date Noted   Unilateral primary osteoarthritis, left knee 02/17/2022   Unilateral primary osteoarthritis, right knee 02/17/2022   Essential hypertension 07/17/2020   Hyperlipidemia 07/17/2020   Tinnitus 07/17/2020   No past medical history on file.  No family history on file.  Past Surgical History:  Procedure Laterality Date   APPENDECTOMY      TONSILLECTOMY     at age 78   Social History   Occupational History   Not on file  Tobacco Use   Smoking status: Never   Smokeless tobacco: Never  Substance and Sexual Activity   Alcohol use: Not Currently    Alcohol/week: 0.0 standard drinks of alcohol   Drug use: Not Currently   Sexual activity: Not Currently

## 2023-10-16 ENCOUNTER — Telehealth: Payer: Self-pay

## 2023-10-16 NOTE — Telephone Encounter (Signed)
VOB submitted for Durolane, bilateral knee  

## 2024-01-06 ENCOUNTER — Telehealth: Payer: Self-pay

## 2024-01-06 NOTE — Telephone Encounter (Signed)
 Clearances received.  I called and left voice mail message for patient to call me back to schedule surgery.

## 2024-01-07 NOTE — Progress Notes (Signed)
 Surgical Instructions   Your procedure is scheduled on January 12, 2024. Report to [facility] Main Entrance "A" at 9:30 A.M., then check in with the Admitting office. Any questions or running late day of surgery: call [phone]  Questions prior to your surgery date: call [phone], Monday-Friday, 8am-4pm. If you experience any cold or flu symptoms such as cough, fever, chills, shortness of breath, etc. between now and your scheduled surgery, please notify us at the above number.     Remember:  Do not eat after midnight the night before your surgery  You may drink clear liquids until 8:30 the morning of your surgery.   Clear liquids allowed are: Water, Non-Citrus Juices (without pulp), Carbonated Beverages, Clear Tea (no milk, honey, etc.), Black Coffee Only (NO MILK, CREAM OR POWDERED CREAMER of any kind), and Gatorade. Patient Instructions  The night before surgery:  No food after midnight. ONLY clear liquids after midnight  The day of surgery (if you do NOT have diabetes):  Drink ONE (1) Pre-Surgery Clear Ensure by 8:30 the morning of surgery. Drink in one sitting. Do not sip.  This drink was given to you during your hospital  pre-op appointment visit.  Nothing else to drink after completing the  Pre-Surgery Clear Ensure.          If you have questions, please contact your surgeon's office.    Take these medicines the morning of surgery with A SIP OF WATER NONE   May take these medicines IF NEEDED:    One week prior to surgery, STOP taking any Aspirin (unless otherwise instructed by your surgeon) Aleve, Naproxen, Ibuprofen, Motrin, Advil, Goody's, BC's, all herbal medications, fish oil, and non-prescription vitamins.                     Do NOT Smoke (Tobacco/Vaping) for 24 hours prior to your procedure.  If you use a CPAP at night, you may bring your mask/headgear for your overnight stay.   You will be asked to remove any contacts, glasses, piercing's, hearing  aid's, dentures/partials prior to surgery. Please bring cases for these items if needed.    Patients discharged the day of surgery will not be allowed to drive home, and someone needs to stay with them for 24 hours.  SURGICAL WAITING ROOM VISITATION Patients may have no more than 2 support people in the waiting area - these visitors may rotate.   Pre-op nurse will coordinate an appropriate time for 1 ADULT support person, who may not rotate, to accompany patient in pre-op.  Children under the age of 68 must have an adult with them who is not the patient and must remain in the main waiting area with an adult.  If the patient needs to stay at the hospital during part of their recovery, the visitor guidelines for inpatient rooms apply.  Please refer to the [facility] website for the visitor guidelines for any additional information.   If you received a COVID test during your pre-op visit  it is requested that you wear a mask when out in public, stay away from anyone that may not be feeling well and notify your surgeon if you develop symptoms. If you have been in contact with anyone that has tested positive in the last 10 days please notify you surgeon.      Pre-operative 5 CHG Bathing Instructions   You can play a key role in reducing the risk of infection after surgery. Your skin needs to be  as free of germs as possible. You can reduce the number of germs on your skin by washing with CHG (chlorhexidine gluconate) soap before surgery. CHG is an antiseptic soap that kills germs and continues to kill germs even after washing.   DO NOT use if you have an allergy to chlorhexidine/CHG or antibacterial soaps. If your skin becomes reddened or irritated, stop using the CHG and notify one of our RNs at [phone].   Please shower with the CHG soap starting 4 days before surgery using the following schedule:     Please keep in mind the following:  DO NOT shave, including legs and underarms,  starting the day of your first shower.   You may shave your face at any point before/day of surgery.  Place clean sheets on your bed the day you start using CHG soap. Use a clean washcloth (not used since being washed) for each shower. DO NOT sleep with pets once you start using the CHG.   CHG Shower Instructions:  Wash your face and private area with normal soap. If you choose to wash your hair, wash first with your normal shampoo.  After you use shampoo/soap, rinse your hair and body thoroughly to remove shampoo/soap residue.  Turn the water OFF and apply about 3 tablespoons (45 ml) of CHG soap to a CLEAN washcloth.  Apply CHG soap ONLY FROM YOUR NECK DOWN TO YOUR TOES (washing for 3-5 minutes)  DO NOT use CHG soap on face, private areas, open wounds, or sores.  Pay special attention to the area where your surgery is being performed.  If you are having back surgery, having someone wash your back for you may be helpful. Wait 2 minutes after CHG soap is applied, then you may rinse off the CHG soap.  Pat dry with a clean towel  Put on clean clothes/pajamas   If you choose to wear lotion, please use ONLY the CHG-compatible lotions that are listed below.  Additional instructions for the day of surgery: DO NOT APPLY any lotions, deodorants, cologne, or perfumes.   Do not bring valuables to the hospital. [facility] is not responsible for any belongings/valuables. Do not wear nail polish, gel polish, artificial nails, or any other type of covering on natural nails (fingers and toes) Do not wear jewelry or makeup Put on clean/comfortable clothes.  Please brush your teeth.  Ask your nurse before applying any prescription medications to the skin.     CHG Compatible Lotions   Aveeno Moisturizing lotion  Cetaphil Moisturizing Cream  Cetaphil Moisturizing Lotion  Clairol Herbal Essence Moisturizing Lotion, Dry Skin  Clairol Herbal Essence Moisturizing Lotion, Extra Dry Skin  Clairol Herbal  Essence Moisturizing Lotion, Normal Skin  Curel Age Defying Therapeutic Moisturizing Lotion with Alpha Hydroxy  Curel Extreme Care Body Lotion  Curel Soothing Hands Moisturizing Hand Lotion  Curel Therapeutic Moisturizing Cream, Fragrance-Free  Curel Therapeutic Moisturizing Lotion, Fragrance-Free  Curel Therapeutic Moisturizing Lotion, Original Formula  Eucerin Daily Replenishing Lotion  Eucerin Dry Skin Therapy Plus Alpha Hydroxy Crme  Eucerin Dry Skin Therapy Plus Alpha Hydroxy Lotion  Eucerin Original Crme  Eucerin Original Lotion  Eucerin Plus Crme Eucerin Plus Lotion  Eucerin TriLipid Replenishing Lotion  Keri Anti-Bacterial Hand Lotion  Keri Deep Conditioning Original Lotion Dry Skin Formula Softly Scented  Keri Deep Conditioning Original Lotion, Fragrance Free Sensitive Skin Formula  Keri Lotion Fast Absorbing Fragrance Free Sensitive Skin Formula  Keri Lotion Fast Absorbing Softly Scented Dry Skin Formula  Keri Original Lotion  Keri Skin Renewal Lotion Keri Silky Smooth Lotion  Keri Silky Smooth Sensitive Skin Lotion  Nivea Body Creamy Conditioning Oil  Nivea Body Extra Enriched Teacher, adult education Moisturizing Lotion Nivea Crme  Nivea Skin Firming Lotion  NutraDerm 30 Skin Lotion  NutraDerm Skin Lotion  NutraDerm Therapeutic Skin Cream  NutraDerm Therapeutic Skin Lotion  ProShield Protective Hand Cream  Provon moisturizing lotion  Please read over the following fact sheets that you were given.

## 2024-01-08 ENCOUNTER — Other Ambulatory Visit: Payer: Self-pay

## 2024-01-08 ENCOUNTER — Encounter (HOSPITAL_COMMUNITY)
Admission: RE | Admit: 2024-01-08 | Discharge: 2024-01-08 | Disposition: A | Discharge status: Home / Self Care | Source: Ambulatory Visit | Attending: Orthopaedic Surgery | Admitting: Orthopaedic Surgery

## 2024-01-08 ENCOUNTER — Encounter (HOSPITAL_COMMUNITY): Payer: Self-pay

## 2024-01-08 VITALS — BP 161/89 | HR 82 | Temp 97.9°F | Resp 18 | Ht 68.0 in | Wt 219.8 lb

## 2024-01-08 DIAGNOSIS — Z01812 Encounter for preprocedural laboratory examination: Secondary | ICD-10-CM | POA: Insufficient documentation

## 2024-01-08 DIAGNOSIS — M1712 Unilateral primary osteoarthritis, left knee: Secondary | ICD-10-CM | POA: Insufficient documentation

## 2024-01-08 DIAGNOSIS — Z01818 Encounter for other preprocedural examination: Secondary | ICD-10-CM

## 2024-01-08 LAB — CBC
HCT: 47 % (ref 39.0–52.0)
Hemoglobin: 16 g/dL (ref 13.0–17.0)
MCH: 31.9 pg (ref 26.0–34.0)
MCHC: 34 g/dL (ref 30.0–36.0)
MCV: 93.8 fL (ref 80.0–100.0)
Platelets: 297 10*3/uL (ref 150–400)
RBC: 5.01 MIL/uL (ref 4.22–5.81)
RDW: 12.3 % (ref 11.5–15.5)
WBC: 9.8 10*3/uL (ref 4.0–10.5)
nRBC: 0 % (ref 0.0–0.2)

## 2024-01-08 LAB — SURGICAL PCR SCREEN
MRSA, PCR: NEGATIVE
Staphylococcus aureus: NEGATIVE

## 2024-01-08 LAB — BASIC METABOLIC PANEL WITH GFR
Anion gap: 8 (ref 5–15)
BUN: 14 mg/dL (ref 8–23)
CO2: 27 mmol/L (ref 22–32)
Calcium: 9.5 mg/dL (ref 8.9–10.3)
Chloride: 104 mmol/L (ref 98–111)
Creatinine, Ser: 0.88 mg/dL (ref 0.61–1.24)
GFR, Estimated: >60 mL/min (ref >60)
Glucose, Bld: 92 mg/dL (ref 70–99)
Potassium: 4.6 mmol/L (ref 3.5–5.1)
Sodium: 139 mmol/L (ref 135–145)

## 2024-01-08 NOTE — Progress Notes (Signed)
 PCP - [facility] Cardiologist -   PPM/ICD - denies Device Orders - n/a Rep Notified - n/a  Chest x-ray - denies EKG - denies Stress Test - denies ECHO - denies Cardiac Cath - denies  Sleep Study - denies CPAP -   DM -denies  Blood Thinner Instructions:denies Aspirin Instructions:  ERAS Protcol - clear liquids until PRE-SURGERY Ensure   COVID TEST- n/a   Anesthesia review: no  Patient denies shortness of breath, fever, cough and chest pain at PAT appointment   All instructions explained to the patient, with a verbal understanding of the material. Patient agrees to go over the instructions while at home for a better understanding. Patient also instructed to self quarantine after being tested for COVID-19. The opportunity to ask questions was provided.

## 2024-01-11 NOTE — H&P (Signed)
 TOTAL KNEE ADMISSION H&P  Patient is being admitted for left total knee arthroplasty.  Subjective:  Chief Complaint:left knee pain.  HPI: Steve Buchanan, 78 y.o. male, has a history of pain and functional disability in the left knee due to arthritis and has failed non-surgical conservative treatments for greater than 12 weeks to includeNSAID's and/or analgesics, corticosteriod injections, viscosupplementation injections, flexibility and strengthening excercises, weight reduction as appropriate, and activity modification.  Onset of symptoms was gradual, starting many years ago with gradually worsening course since that time. The patient noted no past surgery on the left knee(s).  Patient currently rates pain in the left knee(s) at 10 out of 10 with activity. Patient has night pain, worsening of pain with activity and weight bearing, pain that interferes with activities of daily living, pain with passive range of motion, crepitus, and joint swelling.  Patient has evidence of subchondral sclerosis, periarticular osteophytes, and joint space narrowing by imaging studies. There is no active infection.  Patient Active Problem List   Diagnosis Date Noted   Unilateral primary osteoarthritis, left knee 02/17/2022   Unilateral primary osteoarthritis, right knee 02/17/2022   Essential hypertension 07/17/2020   Hyperlipidemia 07/17/2020   Tinnitus 07/17/2020   No past medical history on file.  Past Surgical History:  Procedure Laterality Date   APPENDECTOMY     menicus Right    TONSILLECTOMY     at age 47    No current facility-administered medications for this encounter.   Current Outpatient Medications  Medication Sig Dispense Refill Last Dose/Taking   aspirin 325 MG tablet Take 325-650 mg by mouth every 8 (eight) hours as needed (pain.).   Taking As Needed   atorvastatin (LIPITOR) 40 MG tablet Take 40 mg by mouth at bedtime.   Taking   Vitamin D, Ergocalciferol, (DRISDOL) 1.25 MG (50000 UNIT)  CAPS capsule Take 50,000 Units by mouth every Sunday at 6pm.   Taking   No Known Allergies  Social History   Tobacco Use   Smoking status: Never   Smokeless tobacco: Never  Substance Use Topics   Alcohol use: Not Currently    Alcohol/week: 0.0 standard drinks of alcohol    No family history on file.   Review of Systems  Objective:  Physical Exam Vitals reviewed.  Constitutional:      Appearance: Normal appearance. He is obese.  HENT:     Head: Normocephalic and atraumatic.  Eyes:     Extraocular Movements: Extraocular movements intact.     Pupils: Pupils are equal, round, and reactive to light.  Cardiovascular:     Rate and Rhythm: Normal rate and regular rhythm.  Pulmonary:     Effort: Pulmonary effort is normal.     Breath sounds: Normal breath sounds.  Abdominal:     Palpations: Abdomen is soft.  Musculoskeletal:     Cervical back: Normal range of motion and neck supple.     Left knee: Effusion, bony tenderness and crepitus present. Decreased range of motion. Tenderness present over the medial joint line and lateral joint line. Abnormal alignment.  Neurological:     Mental Status: He is alert and oriented to person, place, and time.  Psychiatric:        Behavior: Behavior normal.     Vital signs in last 24 hours:    Labs:   Estimated body mass index is 33.42 kg/m as calculated from the following:   Height as of 01/08/24: 5\' 8"  (1.727 m).   Weight as of 01/08/24: 99.7  kg.   Imaging Review Plain radiographs demonstrate severe degenerative joint disease of the left knee(s). The overall alignment ismild varus. The bone quality appears to be excellent for age and reported activity level.      Assessment/Plan:  End stage arthritis, left knee   The patient history, physical examination, clinical judgment of the provider and imaging studies are consistent with end stage degenerative joint disease of the left knee(s) and total knee arthroplasty is deemed  medically necessary. The treatment options including medical management, injection therapy arthroscopy and arthroplasty were discussed at length. The risks and benefits of total knee arthroplasty were presented and reviewed. The risks due to aseptic loosening, infection, stiffness, patella tracking problems, thromboembolic complications and other imponderables were discussed. The patient acknowledged the explanation, agreed to proceed with the plan and consent was signed. Patient is being admitted for inpatient treatment for surgery, pain control, PT, OT, prophylactic antibiotics, VTE prophylaxis, progressive ambulation and ADL's and discharge planning. The patient is planning to be discharged home with home health services

## 2024-01-11 NOTE — Anesthesia Preprocedure Evaluation (Signed)
 Anesthesia Evaluation  Patient identified by MRN, date of birth, ID band Patient awake    Reviewed: Allergy & Precautions, NPO status , Patient's Chart, lab work & pertinent test results  History of Anesthesia Complications Negative for: history of anesthetic complications  Airway Mallampati: II  TM Distance: >3 FB Neck ROM: Full    Dental no notable dental hx.    Pulmonary neg pulmonary ROS   Pulmonary exam normal        Cardiovascular hypertension, Normal cardiovascular exam     Neuro/Psych negative neurological ROS     GI/Hepatic negative GI ROS, Neg liver ROS,,,  Endo/Other  negative endocrine ROS    Renal/GU negative Renal ROS     Musculoskeletal  (+) Arthritis ,    Abdominal   Peds  Hematology negative hematology ROS (+)   Anesthesia Other Findings Day of surgery medications reviewed with patient.  Reproductive/Obstetrics                              Anesthesia Physical Anesthesia Plan  ASA: 2  Anesthesia Plan: Spinal   Post-op Pain Management: Tylenol PO (pre-op)* and Regional block*   Induction:   PONV Risk Score and Plan: 2 and Treatment may vary due to age or medical condition, Ondansetron, Propofol infusion and Dexamethasone  Airway Management Planned: Natural Airway and Simple Face Mask  Additional Equipment: None  Intra-op Plan:   Post-operative Plan:   Informed Consent: I have reviewed the patients History and Physical, chart, labs and discussed the procedure including the risks, benefits and alternatives for the proposed anesthesia with the patient or authorized representative who has indicated his/her understanding and acceptance.       Plan Discussed with: CRNA  Anesthesia Plan Comments:         Anesthesia Quick Evaluation

## 2024-01-12 ENCOUNTER — Other Ambulatory Visit: Payer: Self-pay

## 2024-01-12 ENCOUNTER — Observation Stay (HOSPITAL_COMMUNITY)
Admission: RE | Admit: 2024-01-12 | Discharge: 2024-01-14 | Disposition: A | Discharge status: Home / Self Care | Attending: Orthopaedic Surgery | Admitting: Orthopaedic Surgery

## 2024-01-12 ENCOUNTER — Observation Stay (HOSPITAL_COMMUNITY)

## 2024-01-12 ENCOUNTER — Encounter (HOSPITAL_COMMUNITY)
Admission: RE | Disposition: A | Discharge status: Home / Self Care | Payer: Self-pay | Source: Home / Self Care | Attending: Orthopaedic Surgery

## 2024-01-12 ENCOUNTER — Ambulatory Visit (HOSPITAL_BASED_OUTPATIENT_CLINIC_OR_DEPARTMENT_OTHER): Admitting: Anesthesiology

## 2024-01-12 ENCOUNTER — Ambulatory Visit (HOSPITAL_COMMUNITY): Admitting: Anesthesiology

## 2024-01-12 DIAGNOSIS — M25562 Pain in left knee: Secondary | ICD-10-CM | POA: Diagnosis present

## 2024-01-12 DIAGNOSIS — M1712 Unilateral primary osteoarthritis, left knee: Secondary | ICD-10-CM

## 2024-01-12 DIAGNOSIS — Z96652 Presence of left artificial knee joint: Secondary | ICD-10-CM | POA: Insufficient documentation

## 2024-01-12 DIAGNOSIS — Z79899 Other long term (current) drug therapy: Secondary | ICD-10-CM | POA: Diagnosis not present

## 2024-01-12 DIAGNOSIS — Z7901 Long term (current) use of anticoagulants: Secondary | ICD-10-CM | POA: Diagnosis not present

## 2024-01-12 DIAGNOSIS — M179 Osteoarthritis of knee, unspecified: Secondary | ICD-10-CM | POA: Diagnosis present

## 2024-01-12 HISTORY — PX: TOTAL KNEE ARTHROPLASTY: SHX125

## 2024-01-12 LAB — BASIC METABOLIC PANEL WITH GFR
Anion gap: 9 (ref 5–15)
BUN: 12 mg/dL (ref 8–23)
CO2: 23 mmol/L (ref 22–32)
Calcium: 8.9 mg/dL (ref 8.9–10.3)
Chloride: 104 mmol/L (ref 98–111)
Creatinine, Ser: 0.71 mg/dL (ref 0.61–1.24)
GFR, Estimated: >60 mL/min (ref >60)
Glucose, Bld: 116 mg/dL — ABNORMAL HIGH (ref 70–99)
Potassium: 4.3 mmol/L (ref 3.5–5.1)
Sodium: 136 mmol/L (ref 135–145)

## 2024-01-12 LAB — CBC
HCT: 41 % (ref 39.0–52.0)
Hemoglobin: 14.3 g/dL (ref 13.0–17.0)
MCH: 32.1 pg (ref 26.0–34.0)
MCHC: 34.9 g/dL (ref 30.0–36.0)
MCV: 92.1 fL (ref 80.0–100.0)
Platelets: 235 10*3/uL (ref 150–400)
RBC: 4.45 MIL/uL (ref 4.22–5.81)
RDW: 12.4 % (ref 11.5–15.5)
WBC: 16.1 10*3/uL — ABNORMAL HIGH (ref 4.0–10.5)
nRBC: 0 % (ref 0.0–0.2)

## 2024-01-12 SURGERY — ARTHROPLASTY, KNEE, TOTAL
Anesthesia: Spinal | Site: Knee | Laterality: Left

## 2024-01-12 MED ORDER — FENTANYL CITRATE (PF) 100 MCG/2ML IJ SOLN
INTRAMUSCULAR | Status: AC
  Administered 2024-01-12: 50 ug
  Filled 2024-01-12: qty 2

## 2024-01-12 MED ORDER — HYDROMORPHONE HCL 1 MG/ML IJ SOLN
0.5000 mg | INTRAMUSCULAR | Status: DC | PRN
  Administered 2024-01-12 – 2024-01-13 (×2): 1 mg via INTRAVENOUS
  Filled 2024-01-12: qty 1

## 2024-01-12 MED ORDER — BUPIVACAINE-EPINEPHRINE (PF) 0.25% -1:200000 IJ SOLN
INTRAMUSCULAR | Status: AC
Start: 2024-01-12 — End: ?
  Filled 2024-01-12: qty 30

## 2024-01-12 MED ORDER — MIDAZOLAM HCL 2 MG/2ML IJ SOLN
INTRAMUSCULAR | Status: AC
  Administered 2024-01-12: 1 mg via INTRAVENOUS
  Filled 2024-01-12: qty 2

## 2024-01-12 MED ORDER — BUPIVACAINE IN DEXTROSE 0.75-8.25 % IT SOLN
INTRATHECAL | Status: DC | PRN
  Administered 2024-01-12: 1.6 mL via INTRATHECAL

## 2024-01-12 MED ORDER — METHOCARBAMOL 1000 MG/10ML IJ SOLN: 500.0000 mg | Freq: Four times a day (QID) | INTRAMUSCULAR | Status: DC | PRN

## 2024-01-12 MED ORDER — METOCLOPRAMIDE HCL 5 MG PO TABS: 5.0000 mg | ORAL_TABLET | Freq: Three times a day (TID) | ORAL | Status: DC | PRN

## 2024-01-12 MED ORDER — DIPHENHYDRAMINE HCL 12.5 MG/5ML PO ELIX: 12.5000 mg | ORAL_SOLUTION | ORAL | Status: DC | PRN

## 2024-01-12 MED ORDER — OXYCODONE HCL 5 MG/5ML PO SOLN: 5.0000 mg | Freq: Once | ORAL | Status: DC | PRN

## 2024-01-12 MED ORDER — ACETAMINOPHEN 500 MG PO TABS
1000.0000 mg | ORAL_TABLET | Freq: Once | ORAL | Status: AC
  Administered 2024-01-12: 1000 mg via ORAL
  Filled 2024-01-12: qty 2

## 2024-01-12 MED ORDER — TRANEXAMIC ACID-NACL 1000-0.7 MG/100ML-% IV SOLN
1000.0000 mg | INTRAVENOUS | Status: AC
  Administered 2024-01-12: 1000 mg via INTRAVENOUS
  Filled 2024-01-12: qty 100

## 2024-01-12 MED ORDER — HYDROMORPHONE HCL 1 MG/ML IJ SOLN
INTRAMUSCULAR | Status: AC
  Filled 2024-01-12: qty 1

## 2024-01-12 MED ORDER — OXYCODONE HCL 5 MG PO TABS
ORAL_TABLET | ORAL | Status: AC
  Filled 2024-01-12: qty 2

## 2024-01-12 MED ORDER — OXYCODONE HCL 5 MG PO TABS: 5.0000 mg | ORAL_TABLET | Freq: Once | ORAL | Status: DC | PRN

## 2024-01-12 MED ORDER — PHENYLEPHRINE HCL-NACL 20-0.9 MG/250ML-% IV SOLN
INTRAVENOUS | Status: DC | PRN
  Administered 2024-01-12: 25 ug/min via INTRAVENOUS

## 2024-01-12 MED ORDER — MIDAZOLAM HCL 2 MG/2ML IJ SOLN: 1.0000 mg | Freq: Once | INTRAMUSCULAR | Status: AC

## 2024-01-12 MED ORDER — BUPIVACAINE-EPINEPHRINE 0.25% -1:200000 IJ SOLN
INTRAMUSCULAR | Status: DC | PRN
  Administered 2024-01-12: 30 mL

## 2024-01-12 MED ORDER — PANTOPRAZOLE SODIUM 40 MG PO TBEC
40.0000 mg | DELAYED_RELEASE_TABLET | Freq: Every day | ORAL | Status: DC
  Administered 2024-01-12 – 2024-01-14 (×3): 40 mg via ORAL
  Filled 2024-01-12 (×3): qty 1

## 2024-01-12 MED ORDER — MENTHOL 3 MG MT LOZG: 1.0000 | LOZENGE | OROMUCOSAL | Status: DC | PRN

## 2024-01-12 MED ORDER — 0.9 % SODIUM CHLORIDE (POUR BTL) OPTIME
TOPICAL | Status: DC | PRN
  Administered 2024-01-12: 1000 mL

## 2024-01-12 MED ORDER — ONDANSETRON HCL 4 MG PO TABS: 4.0000 mg | ORAL_TABLET | Freq: Four times a day (QID) | ORAL | Status: DC | PRN

## 2024-01-12 MED ORDER — CEFAZOLIN SODIUM-DEXTROSE 2-4 GM/100ML-% IV SOLN
2.0000 g | INTRAVENOUS | Status: AC
  Administered 2024-01-12: 2 g via INTRAVENOUS
  Filled 2024-01-12: qty 100

## 2024-01-12 MED ORDER — CLONIDINE HCL (ANALGESIA) 100 MCG/ML EP SOLN
EPIDURAL | Status: DC | PRN
  Administered 2024-01-12: 100 ug

## 2024-01-12 MED ORDER — METOCLOPRAMIDE HCL 5 MG/ML IJ SOLN: 5.0000 mg | Freq: Three times a day (TID) | INTRAMUSCULAR | Status: DC | PRN

## 2024-01-12 MED ORDER — METHOCARBAMOL 500 MG PO TABS
500.0000 mg | ORAL_TABLET | Freq: Four times a day (QID) | ORAL | Status: DC | PRN
  Administered 2024-01-12: 500 mg via ORAL

## 2024-01-12 MED ORDER — METHOCARBAMOL 500 MG PO TABS
ORAL_TABLET | ORAL | Status: AC
  Filled 2024-01-12: qty 1

## 2024-01-12 MED ORDER — PHENOL 1.4 % MT LIQD: 1.0000 | OROMUCOSAL | Status: DC | PRN

## 2024-01-12 MED ORDER — SODIUM CHLORIDE 0.9 % IV SOLN: INTRAVENOUS | Status: AC

## 2024-01-12 MED ORDER — CHLORHEXIDINE GLUCONATE 0.12 % MT SOLN
15.0000 mL | Freq: Once | OROMUCOSAL | Status: AC
  Administered 2024-01-12: 15 mL via OROMUCOSAL
  Filled 2024-01-12: qty 15

## 2024-01-12 MED ORDER — ACETAMINOPHEN 325 MG PO TABS
325.0000 mg | ORAL_TABLET | Freq: Four times a day (QID) | ORAL | Status: DC | PRN
  Administered 2024-01-13 (×2): 650 mg via ORAL
  Filled 2024-01-12 (×2): qty 2

## 2024-01-12 MED ORDER — FENTANYL CITRATE (PF) 100 MCG/2ML IJ SOLN: 25.0000 ug | INTRAMUSCULAR | Status: DC | PRN

## 2024-01-12 MED ORDER — LACTATED RINGERS IV SOLN: INTRAVENOUS | Status: DC | PRN

## 2024-01-12 MED ORDER — ORAL CARE MOUTH RINSE: 15.0000 mL | Freq: Once | OROMUCOSAL | Status: AC

## 2024-01-12 MED ORDER — DOCUSATE SODIUM 100 MG PO CAPS
100.0000 mg | ORAL_CAPSULE | Freq: Two times a day (BID) | ORAL | Status: DC
  Administered 2024-01-12 – 2024-01-14 (×4): 100 mg via ORAL
  Filled 2024-01-12 (×4): qty 1

## 2024-01-12 MED ORDER — ALUM & MAG HYDROXIDE-SIMETH 200-200-20 MG/5ML PO SUSP: 30.0000 mL | ORAL | Status: DC | PRN

## 2024-01-12 MED ORDER — OXYCODONE HCL 5 MG PO TABS
10.0000 mg | ORAL_TABLET | ORAL | Status: DC | PRN
Start: 2024-01-12 — End: 2024-01-14
  Administered 2024-01-12: 15 mg via ORAL
  Administered 2024-01-14 (×2): 10 mg via ORAL
  Filled 2024-01-12: qty 2
  Filled 2024-01-12: qty 3

## 2024-01-12 MED ORDER — ONDANSETRON HCL 4 MG/2ML IJ SOLN: 4.0000 mg | Freq: Four times a day (QID) | INTRAMUSCULAR | Status: DC | PRN

## 2024-01-12 MED ORDER — PROPOFOL 500 MG/50ML IV EMUL
INTRAVENOUS | Status: DC | PRN
  Administered 2024-01-12: 80 ug/kg/min via INTRAVENOUS
  Administered 2024-01-12: 100 ug/kg/min via INTRAVENOUS

## 2024-01-12 MED ORDER — FENTANYL CITRATE (PF) 100 MCG/2ML IJ SOLN: 50.0000 ug | Freq: Once | INTRAMUSCULAR | Status: DC

## 2024-01-12 MED ORDER — CEFAZOLIN SODIUM-DEXTROSE 2-4 GM/100ML-% IV SOLN
2.0000 g | Freq: Four times a day (QID) | INTRAVENOUS | Status: AC
  Administered 2024-01-12 – 2024-01-13 (×2): 2 g via INTRAVENOUS
  Filled 2024-01-12 (×2): qty 100

## 2024-01-12 MED ORDER — BUPIVACAINE-EPINEPHRINE (PF) 0.5% -1:200000 IJ SOLN
INTRAMUSCULAR | Status: DC | PRN
  Administered 2024-01-12: 15 mL via PERINEURAL

## 2024-01-12 MED ORDER — OXYCODONE HCL 5 MG PO TABS
5.0000 mg | ORAL_TABLET | ORAL | Status: DC | PRN
Start: 2024-01-12 — End: 2024-01-14
  Administered 2024-01-12 – 2024-01-13 (×3): 10 mg via ORAL
  Filled 2024-01-12 (×3): qty 2

## 2024-01-12 MED ORDER — SODIUM CHLORIDE 0.9 % IR SOLN
Status: DC | PRN
  Administered 2024-01-12: 1000 mL

## 2024-01-12 MED ORDER — ASPIRIN 81 MG PO CHEW
81.0000 mg | CHEWABLE_TABLET | Freq: Two times a day (BID) | ORAL | Status: DC
  Administered 2024-01-12 – 2024-01-14 (×4): 81 mg via ORAL
  Filled 2024-01-12 (×4): qty 1

## 2024-01-12 SURGICAL SUPPLY — 62 items
BAG COUNTER SPONGE SURGICOUNT (BAG) ×1 IMPLANT
BANDAGE ESMARK 6X9 LF (GAUZE/BANDAGES/DRESSINGS) ×1 IMPLANT
BLADE SAG 18X100X1.27 (BLADE) ×1 IMPLANT
BNDG ELASTIC 6INX 5YD STR LF (GAUZE/BANDAGES/DRESSINGS) ×2 IMPLANT
BNDG ELASTIC 6X10 VLCR STRL LF (GAUZE/BANDAGES/DRESSINGS) IMPLANT
BNDG ESMARK 6X9 LF (GAUZE/BANDAGES/DRESSINGS) ×1 IMPLANT
BOWL SMART MIX CTS (DISPOSABLE) IMPLANT
CATH FOLEY 2WAY SLVR 5CC 16FR (CATHETERS) IMPLANT
COMP FEM PS KNEE STD 10 LT (Knees) ×1 IMPLANT
COMP PATELLA 3 PEG 38 (Joint) ×1 IMPLANT
COMP TIB PS G 0D LT (Joint) ×1 IMPLANT
COMPONENT FEM PS KN STD 10 LT (Knees) IMPLANT
COMPONENT PATELLA 3 PEG 38 (Joint) IMPLANT
COMPONET TIB PS G 0D LT (Joint) IMPLANT
COOLER ICEMAN CLASSIC (MISCELLANEOUS) ×1 IMPLANT
COVER SURGICAL LIGHT HANDLE (MISCELLANEOUS) ×1 IMPLANT
CUFF TOURN SGL QUICK 42 (TOURNIQUET CUFF) IMPLANT
CUFF TRNQT CYL 34X4.125X (TOURNIQUET CUFF) ×1 IMPLANT
DRAPE EXTREMITY T 121X128X90 (DISPOSABLE) ×1 IMPLANT
DRAPE HALF SHEET 40X57 (DRAPES) ×1 IMPLANT
DRAPE U-SHAPE 47X51 STRL (DRAPES) ×1 IMPLANT
DRSG ADAPTIC 3X8 NADH LF (GAUZE/BANDAGES/DRESSINGS) IMPLANT
DURAPREP 26ML APPLICATOR (WOUND CARE) ×1 IMPLANT
ELECT CAUTERY BLADE 6.4 (BLADE) ×1 IMPLANT
ELECT REM PT RETURN 9FT ADLT (ELECTROSURGICAL) ×1 IMPLANT
ELECTRODE REM PT RTRN 9FT ADLT (ELECTROSURGICAL) ×1 IMPLANT
FACESHIELD WRAPAROUND (MASK) ×2 IMPLANT
FACESHIELD WRAPAROUND OR TEAM (MASK) ×2 IMPLANT
GAUZE PAD ABD 8X10 STRL (GAUZE/BANDAGES/DRESSINGS) ×1 IMPLANT
GAUZE SPONGE 4X4 12PLY STRL (GAUZE/BANDAGES/DRESSINGS) ×1 IMPLANT
GAUZE XEROFORM 1X8 LF (GAUZE/BANDAGES/DRESSINGS) ×1 IMPLANT
GLOVE BIOGEL PI IND STRL 8 (GLOVE) ×2 IMPLANT
GLOVE ORTHO TXT STRL SZ7.5 (GLOVE) ×1 IMPLANT
GLOVE SURG ORTHO 8.0 STRL STRW (GLOVE) ×1 IMPLANT
GOWN STRL REUS W/ TWL LRG LVL3 (GOWN DISPOSABLE) IMPLANT
GOWN STRL REUS W/ TWL XL LVL3 (GOWN DISPOSABLE) ×2 IMPLANT
IMMOBILIZER KNEE 22 UNIV (SOFTGOODS) ×1 IMPLANT
IV NS 1000ML BAXH (IV SOLUTION) ×1 IMPLANT
KIT BASIN OR (CUSTOM PROCEDURE TRAY) ×1 IMPLANT
KIT TURNOVER KIT B (KITS) ×1 IMPLANT
LINER TIB ASF PS GH/8-11 14 LT (Liner) IMPLANT
MANIFOLD NEPTUNE II (INSTRUMENTS) ×1 IMPLANT
NDL 18GX1X1/2 (RX/OR ONLY) (NEEDLE) IMPLANT
NEEDLE 18GX1X1/2 (RX/OR ONLY) (NEEDLE) IMPLANT
NS IRRIG 1000ML POUR BTL (IV SOLUTION) ×1 IMPLANT
PACK TOTAL JOINT (CUSTOM PROCEDURE TRAY) ×1 IMPLANT
PAD ABD 8X10 STRL (GAUZE/BANDAGES/DRESSINGS) IMPLANT
PAD ARMBOARD POSITIONER FOAM (MISCELLANEOUS) ×1 IMPLANT
PAD COLD SHLDR WRAP-ON (PAD) ×1 IMPLANT
PADDING CAST COTTON 6X4 STRL (CAST SUPPLIES) ×1 IMPLANT
PIN DRILL HDLS TROCAR 75 4PK (PIN) IMPLANT
SCREW FEMALE HEX FIX 25X2.5 (ORTHOPEDIC DISPOSABLE SUPPLIES) IMPLANT
SET HNDPC FAN SPRY TIP SCT (DISPOSABLE) ×1 IMPLANT
SET PAD KNEE POSITIONER (MISCELLANEOUS) ×1 IMPLANT
STAPLER VISISTAT 35W (STAPLE) ×1 IMPLANT
SUCTION TUBE FRAZIER 10FR DISP (SUCTIONS) ×1 IMPLANT
SUT VIC AB 0 CT1 27XBRD ANBCTR (SUTURE) ×1 IMPLANT
SUT VIC AB 1 CT1 27XBRD ANBCTR (SUTURE) ×2 IMPLANT
SUT VIC AB 2-0 CT1 TAPERPNT 27 (SUTURE) ×2 IMPLANT
SYR 50ML LL SCALE MARK (SYRINGE) IMPLANT
TOWEL GREEN STERILE (TOWEL DISPOSABLE) ×1 IMPLANT
TOWEL GREEN STERILE FF (TOWEL DISPOSABLE) ×1 IMPLANT

## 2024-01-12 NOTE — Interval H&P Note (Signed)
 History and Physical Interval Note: The patient understands that he is here today for a left total knee replacement to treat his significant left knee pain and arthritis.  There has been no acute or interval changes in medical status.  The risks and benefits of surgery have been discussed in detail and informed consent has been obtained.  The left operative knee has been marked.  01/12/2024 10:59 AM  Steve Buchanan  has presented today for surgery, with the diagnosis of Endstage osteoarthritis left knee.  The various methods of treatment have been discussed with the patient and family. After consideration of risks, benefits and other options for treatment, the patient has consented to  Procedure(s): ARTHROPLASTY, KNEE, TOTAL (Left) as a surgical intervention.  The patient's history has been reviewed, patient examined, no change in status, stable for surgery.  I have reviewed the patient's chart and labs.  Questions were answered to the patient's satisfaction.     Kathryne Hitch

## 2024-01-12 NOTE — Discharge Instructions (Signed)
 Per [facility] clinic policy, our goal is ensure optimal postoperative pain control with a multimodal pain management strategy. For all [facility] patients, our goal is to wean post-operative narcotic medications by 6 weeks post-operatively. If this is not possible due to utilization of pain medication prior to surgery, your [facility] doctor will support your acute post-operative pain control for the first 6 weeks postoperatively, with a plan to transition you back to your primary pain team following that. Steve Buchanan will work to ensure a Therapist, occupational.  INSTRUCTIONS AFTER JOINT REPLACEMENT   Remove items at home which could result in a fall. This includes throw rugs or furniture in walking pathways ICE to the affected joint every three hours while awake for 30 minutes at a time, for at least the first 3-5 days, and then as needed for pain and swelling.  Continue to use ice for pain and swelling. You may notice swelling that will progress down to the foot and ankle.  This is normal after surgery.  Elevate your leg when you are not up walking on it.   Continue to use the breathing machine you got in the hospital (incentive spirometer) which will help keep your temperature down.  It is common for your temperature to cycle up and down following surgery, especially at night when you are not up moving around and exerting yourself.  The breathing machine keeps your lungs expanded and your temperature down.   DIET:  As you were doing prior to hospitalization, we recommend a well-balanced diet.  DRESSING / WOUND CARE / SHOWERING  Keep the surgical dressing until follow up.  The dressing is water proof, so you can shower without any extra covering.  IF THE DRESSING FALLS OFF or the wound gets wet inside, change the dressing with sterile gauze.  Please use good hand washing techniques before changing the dressing.  Do not use any lotions or creams on the incision until instructed by your surgeon.     ACTIVITY  Increase activity slowly as tolerated, but follow the weight bearing instructions below.   No driving for 6 weeks or until further direction given by your physician.  You cannot drive while taking narcotics.  No lifting or carrying greater than 10 lbs. until further directed by your surgeon. Avoid periods of inactivity such as sitting longer than an hour when not asleep. This helps prevent blood clots.  You may return to work once you are authorized by your doctor.     WEIGHT BEARING   Weight bearing as tolerated with assist device (walker, cane, etc) as directed, use it as long as suggested by your surgeon or therapist, typically at least 4-6 weeks.   EXERCISES  Results after joint replacement surgery are often greatly improved when you follow the exercise, range of motion and muscle strengthening exercises prescribed by your doctor. Safety measures are also important to protect the joint from further injury. Any time any of these exercises cause you to have increased pain or swelling, decrease what you are doing until you are comfortable again and then slowly increase them. If you have problems or questions, call your caregiver or physical therapist for advice.   Rehabilitation is important following a joint replacement. After just a few days of immobilization, the muscles of the leg can become weakened and shrink (atrophy).  These exercises are designed to build up the tone and strength of the thigh and leg muscles and to improve motion. Often times heat used for twenty to thirty minutes before  working out will loosen up your tissues and help with improving the range of motion but do not use heat for the first two weeks following surgery (sometimes heat can increase post-operative swelling).   These exercises can be done on a training (exercise) mat, on the floor, on a table or on a bed. Use whatever works the best and is most comfortable for you.    Use music or television  while you are exercising so that the exercises are a pleasant break in your day. This will make your life better with the exercises acting as a break in your routine that you can look forward to.   Perform all exercises about fifteen times, three times per day or as directed.  You should exercise both the operative leg and the other leg as well.  Exercises include:   Quad Sets - Tighten up the muscle on the front of the thigh (Quad) and hold for 5-10 seconds.   Straight Leg Raises - With your knee straight (if you were given a brace, keep it on), lift the leg to 60 degrees, hold for 3 seconds, and slowly lower the leg.  Perform this exercise against resistance later as your leg gets stronger.  Leg Slides: Lying on your back, slowly slide your foot toward your buttocks, bending your knee up off the floor (only go as far as is comfortable). Then slowly slide your foot back down until your leg is flat on the floor again.  Angel Wings: Lying on your back spread your legs to the side as far apart as you can without causing discomfort.  Hamstring Strength:  Lying on your back, push your heel against the floor with your leg straight by tightening up the muscles of your buttocks.  Repeat, but this time bend your knee to a comfortable angle, and push your heel against the floor.  You may put a pillow under the heel to make it more comfortable if necessary.   A rehabilitation program following joint replacement surgery can speed recovery and prevent re-injury in the future due to weakened muscles. Contact your doctor or a physical therapist for more information on knee rehabilitation.    CONSTIPATION  Constipation is defined medically as fewer than three stools per week and severe constipation as less than one stool per week.  Even if you have a regular bowel pattern at home, your normal regimen is likely to be disrupted due to multiple reasons following surgery.  Combination of anesthesia, postoperative  narcotics, change in appetite and fluid intake all can affect your bowels.   YOU MUST use at least one of the following options; they are listed in order of increasing strength to get the job done.  They are all available over the counter, and you may need to use some, POSSIBLY even all of these options:    Drink plenty of fluids (prune juice may be helpful) and high fiber foods Colace 100 mg by mouth twice a day  Senokot for constipation as directed and as needed Dulcolax (bisacodyl), take with full glass of water  Miralax (polyethylene glycol) once or twice a day as needed.  If you have tried all these things and are unable to have a bowel movement in the first 3-4 days after surgery call either your surgeon or your primary doctor.    If you experience loose stools or diarrhea, hold the medications until you stool forms back up.  If your symptoms do not get better within 1 week  or if they get worse, check with your doctor.  If you experience "the worst abdominal pain ever" or develop nausea or vomiting, please contact the office immediately for further recommendations for treatment.   ITCHING:  If you experience itching with your medications, try taking only a single pain pill, or even half a pain pill at a time.  You can also use Benadryl over the counter for itching or also to help with sleep.   TED HOSE STOCKINGS:  Use stockings on both legs until for at least 2 weeks or as directed by physician office. They may be removed at night for sleeping.  MEDICATIONS:  See your medication summary on the "After Visit Summary" that nursing will review with you.  You may have some home medications which will be placed on hold until you complete the course of blood thinner medication.  It is important for you to complete the blood thinner medication as prescribed.  PRECAUTIONS:  If you experience chest pain or shortness of breath - call 911 immediately for transfer to the hospital emergency department.    If you develop a fever greater that 101 F, purulent drainage from wound, increased redness or drainage from wound, foul odor from the wound/dressing, or calf pain - CONTACT YOUR SURGEON.                                                   FOLLOW-UP APPOINTMENTS:  If you do not already have a post-op appointment, please call the office for an appointment to be seen by your surgeon.  Guidelines for how soon to be seen are listed in your "After Visit Summary", but are typically between 1-4 weeks after surgery.  OTHER INSTRUCTIONS:   Knee Replacement:  Do not place pillow under knee, focus on keeping the knee straight while resting. CPM instructions: 0-90 degrees, 2 hours in the morning, 2 hours in the afternoon, and 2 hours in the evening. Place foam block, curve side up under heel at all times except when in CPM or when walking.  DO NOT modify, tear, cut, or change the foam block in any way.  POST-OPERATIVE OPIOID TAPER INSTRUCTIONS: It is important to wean off of your opioid medication as soon as possible. If you do not need pain medication after your surgery it is ok to stop day one. Opioids include: Codeine, Hydrocodone(Norco, Vicodin), Oxycodone(Percocet, oxycontin) and hydromorphone amongst others.  Long term and even short term use of opiods can cause: Increased pain response Dependence Constipation Depression Respiratory depression And more.  Withdrawal symptoms can include Flu like symptoms Nausea, vomiting And more Techniques to manage these symptoms Hydrate well Eat regular healthy meals Stay active Use relaxation techniques(deep breathing, meditating, yoga) Do Not substitute Alcohol to help with tapering If you have been on opioids for less than two weeks and do not have pain than it is ok to stop all together.  Plan to wean off of opioids This plan should start within one week post op of your joint replacement. Maintain the same interval or time between taking each dose  and first decrease the dose.  Cut the total daily intake of opioids by one tablet each day Next start to increase the time between doses. The last dose that should be eliminated is the evening dose.   MAKE SURE YOU:  Understand these instructions.  Get help right away if you are not doing well or get worse.    Thank you for letting us be a part of your medical care team.  It is a privilege we respect greatly.  We hope these instructions will help you stay on track for a fast and full recovery!      Dental Antibiotics:  In most cases prophylactic antibiotics for Dental procdeures after total joint surgery are not necessary.  Exceptions are as follows:  1. History of prior total joint infection  2. Severely immunocompromised (Organ Transplant, cancer chemotherapy, Rheumatoid biologic meds such as Humera)  3. Poorly controlled diabetes (A1C &gt; 8.0, blood glucose over 200)  If you have one of these conditions, contact your surgeon for an antibiotic prescription, prior to your dental procedure.

## 2024-01-12 NOTE — Transfer of Care (Signed)
 Immediate Anesthesia Transfer of Care Note  Patient: Steve Buchanan  Procedure(s) Performed: ARTHROPLASTY, KNEE, TOTAL (Left: Knee)  Patient Location: PACU  Anesthesia Type:Spinal  Level of Consciousness: awake  Airway & Oxygen Therapy: Patient Spontanous Breathing and Patient connected to nasal cannula oxygen  Post-op Assessment: Report given to RN and Post -op Vital signs reviewed and stable  Post vital signs: Reviewed and stable  Last Vitals:  Vitals Value Taken Time  BP 98/74 01/12/24 1416  Temp    Pulse 71 01/12/24 1418  Resp 17 01/12/24 1418  SpO2 95 % 01/12/24 1418  Vitals shown include unfiled device data.  Last Pain:  Vitals:   01/12/24 1021  TempSrc:   PainSc: 2       Patients Stated Pain Goal: 1 (01/12/24 1021)  Complications: No notable events documented.

## 2024-01-12 NOTE — Op Note (Signed)
 Operative Note  Date of operation: 01/12/2024 Preoperative diagnosis: Left knee primary osteoarthritis Postoperative diagnosis: Same  Procedure: Left press-fit total knee replacement  Implants: Biomet/Zimmer persona press-fit knee system Implant Name Type Inv. Item Serial No. Manufacturer Lot No. LRB No. Used Action  LINER TIB ASF PS GH/8-11 14 LT - [id number] Liner LINER TIB ASF PS GH/8-11 14 LT  ZIMMER RECON(ORTH,TRAU,BIO,SG) [id number] Left 1 Implanted  COMP PATELLA 3 PEG 38 - [id number] Joint COMP PATELLA 3 PEG 38  ZIMMER RECON(ORTH,TRAU,BIO,SG) [id number] Left 1 Implanted  COMP FEM PS KNEE STD 10 LT - [id number] Knees COMP FEM PS KNEE STD 10 LT  ZIMMER RECON(ORTH,TRAU,BIO,SG) [id number] Left 1 Implanted  COMP TIB PS G 0D LT - [id number] Joint COMP TIB PS G 0D LT  ZIMMER RECON(ORTH,TRAU,BIO,SG) [id number] Left 1 Implanted   Surgeon: Vanita Panda. Magnus Ivan, MD Assistant: Rexene Edison, PA-C  Anesthesia: #1 left lower extremity adductor canal block, #2 spinal, #3 local Tourniquet time: Under 1 hour EBL: Less than 100 cc Antibiotics: IV Ancef Complications: None  Indications: The patient is an active 78 year old gentleman that we have been seeing for several years now.  He has significant arthritis in both of his knees with the left knee hurting worse than the right knee.  He has tried and failed conservative treatment over many years now including hyaluronic acid injections and steroid injections.  At this point conservative treatment is longer helping.  He has daily left knee pain that is detrimentally affecting his mobility, his quality of life and his actives daily living to the point that he does wish to proceed with a knee replacement for his left knee and we have recommended that as well.  We did discuss the risks of acute blood loss anemia, nerve and vessel injury infection, DVT, implant failure and wound healing issues.  He understands that our goals are hopefully decreased pain, improved  mobility, and improved quality of life.  Procedure description: After informed consent was obtained and the appropriate left knee was marked, anesthesia obtained a left lower extremity adductor canal block in the holding room.  The patient was then brought to the operating room and set up on the operating table where spinal anesthesia was obtained.  He was then laid in the supine position on the operating table and a Foley catheter was placed as well as a nonsterile tourniquet around his upper left thigh.  His left thigh, knee, leg and ankle were prepped and draped with DuraPrep and sterile drapes.  A timeout was called and he was identified as the correct patient the correct left.  An Esmarch was then used to wrap of the leg and the tourniquet was inflated 300 mm of pressure.  With the knee extended a direct midline incision was made over the patella and carried proximally and distally.  A medial parapatellar arthrotomy was then made following a large joint effusion.  With the knee in a flexed position we found complete cartilage loss of the medial compartment of his knee as well as the patellofemoral joint.  We removed remnants of the ACL as well as medial and lateral meniscus.  We then used an extramedullary cutting guide for making our proximal tibia cut correction for varus and valgus and a 7 degree slope.  We made this cut to take 2 mm off the low side and we did back down to more millimeters.  We then used a intramedullary based cutting guide for our distal femur cut setting this for a left knee  at 5 degrees externally rotated and a 10 mm distal femoral cut.  We made this cut without difficulty and brought the knee back down to full extension and a slightly hyperextended with a 10 mm block.  We then went back to the femur and put a femoral sizing guide based off the epicondylar axis.  Based over this we chose a size 10 femur.  We put a 4-in-1 cutting block for a size 10 femur and made our anterior and  posterior cuts followed by the chamfer cuts.  We then backed the tibia and chose a size G left tibial tray for coverage over the tibial plateau setting the rotation off the tibial tubercle and the femur.  We did our drill hole and keel punch over this and found excellent quality bone for press-fit implants.  We then trialed our size G left tibia combined with our size 10 left CR standard femur.  We trialed up to a 14 mm thickness left medial congruent polythene insert we are pleased with range of motion and stability without insert.  We then made a patella cut and drilled 3 holes for a size 38 patella button.  We then removed all trial rotation of the knee after assessing to the stability and range of motion.  We irrigated the knee with normal saline solution using pulsatile lavage.  We then placed Marcaine with epinephrine around the arthrotomy.  Next we dried the knee roll well and with the knee in a flexed position we placed our Biomet/Zimmer persona press-fit tibial tray for a left knee size G followed by placing our size 10 left CR standard press-fit femur.  We placed our 14 mm thickness medial congruent left polythene insert and press-fit our size 38 patella button.  Again we are pleased with range of motion and stability with all implants in place.  We then let the tourniquet down hemostasis was obtained with electrocautery.  The arthrotomy was then closed with interrupted #1 Vicryl suture followed by 0 Vicryl close the deep tissue and 2-0 Vicryl to close subcutaneous tissue.  The skin was closed with staples.  Well-padded sterile dressings applied and the patient was taken the recovery room.  Rexene Edison, PA-C did assist during entire case and beginning to end and his assistance was medically necessary and needed for soft tissue management and retraction, helping guide implant placement and a layered closure of the wound.

## 2024-01-12 NOTE — Anesthesia Procedure Notes (Signed)
 Spinal  Patient location during procedure: OR Start time: 01/12/2024 12:16 PM End time: 01/12/2024 12:19 PM Reason for block: surgical anesthesia Staffing Performed: anesthesiologist  Anesthesiologist: Kaylyn Layer, MD Performed by: Kaylyn Layer, MD Authorized by: Kaylyn Layer, MD   Preanesthetic Checklist Completed: patient identified, IV checked, risks and benefits discussed, surgical consent, monitors and equipment checked, pre-op evaluation and timeout performed Spinal Block Patient position: sitting Prep: DuraPrep and site prepped and draped Patient monitoring: continuous pulse ox, blood pressure and heart rate Approach: midline Location: L3-4 Injection technique: single-shot Needle Needle type: Pencan  Needle gauge: 24 G Needle length: 9 cm Assessment Events: CSF return Additional Notes Risks, benefits, and alternative discussed. Patient gave consent to procedure. Prepped and draped in sitting position. Patient sedated but responsive to voice. Clear CSF obtained on second attempt. Positive terminal aspiration. No pain or paraesthesias with injection. Patient tolerated procedure well. Vital signs stable. Amalia Greenhouse, MD

## 2024-01-12 NOTE — Anesthesia Procedure Notes (Signed)
 Anesthesia Regional Block: Adductor canal block   Pre-Anesthetic Checklist: , timeout performed,  Correct Patient, Correct Site, Correct Laterality,  Correct Procedure, Correct Position, site marked,  Risks and benefits discussed,  Pre-op evaluation,  At surgeon's request and post-op pain management  Laterality: Left  Prep: Maximum Sterile Barrier Precautions used, chloraprep       Needles:  Injection technique: Single-shot  Needle Type: Echogenic Stimulator Needle     Needle Length: 9cm  Needle Gauge: 22     Additional Needles:   Procedures:,,,, ultrasound used (permanent image in chart),,    Narrative:  Start time: 01/12/2024 11:53 AM End time: 01/12/2024 11:56 AM Injection made incrementally with aspirations every 5 mL.  Performed by: Personally  Anesthesiologist: Kaylyn Layer, MD  Additional Notes: Risks, benefits, and alternative discussed. Patient gave consent for procedure. Patient prepped and draped in sterile fashion. Sedation administered, patient remains easily responsive to voice. Relevant anatomy identified with ultrasound guidance. Local anesthetic given in 5cc increments with no signs or symptoms of intravascular injection. No pain or paraesthesias with injection. Patient monitored throughout procedure with signs of LAST or immediate complications. Tolerated well. Ultrasound image placed in chart.  Amalia Greenhouse, MD

## 2024-01-13 ENCOUNTER — Encounter (HOSPITAL_COMMUNITY): Payer: Self-pay | Admitting: Orthopaedic Surgery

## 2024-01-13 DIAGNOSIS — M1712 Unilateral primary osteoarthritis, left knee: Secondary | ICD-10-CM | POA: Diagnosis not present

## 2024-01-13 LAB — BASIC METABOLIC PANEL WITH GFR
Anion gap: 15 (ref 5–15)
BUN: 16 mg/dL (ref 8–23)
CO2: 17 mmol/L — ABNORMAL LOW (ref 22–32)
Calcium: 8.6 mg/dL — ABNORMAL LOW (ref 8.9–10.3)
Chloride: 101 mmol/L (ref 98–111)
Creatinine, Ser: 0.81 mg/dL (ref 0.61–1.24)
GFR, Estimated: >60 mL/min (ref >60)
Glucose, Bld: 152 mg/dL — ABNORMAL HIGH (ref 70–99)
Potassium: 3.8 mmol/L (ref 3.5–5.1)
Sodium: 133 mmol/L — ABNORMAL LOW (ref 135–145)

## 2024-01-13 LAB — CBC
HCT: 39.5 % (ref 39.0–52.0)
Hemoglobin: 13.9 g/dL (ref 13.0–17.0)
MCH: 32.3 pg (ref 26.0–34.0)
MCHC: 35.2 g/dL (ref 30.0–36.0)
MCV: 91.9 fL (ref 80.0–100.0)
Platelets: 244 10*3/uL (ref 150–400)
RBC: 4.3 MIL/uL (ref 4.22–5.81)
RDW: 12.4 % (ref 11.5–15.5)
WBC: 13.2 10*3/uL — ABNORMAL HIGH (ref 4.0–10.5)
nRBC: 0 % (ref 0.0–0.2)

## 2024-01-13 MED ORDER — SODIUM CHLORIDE 0.9 % IV BOLUS
500.0000 mL | Freq: Once | INTRAVENOUS | Status: AC
  Administered 2024-01-13: 500 mL via INTRAVENOUS

## 2024-01-13 MED ORDER — TAMSULOSIN HCL 0.4 MG PO CAPS
0.4000 mg | ORAL_CAPSULE | Freq: Every day | ORAL | Status: DC
  Administered 2024-01-13: 0.4 mg via ORAL
  Filled 2024-01-13: qty 1

## 2024-01-13 NOTE — TOC Initial Note (Signed)
 Transition of Care [facility]) - Initial/Assessment Note    Patient Details  Name: Steve Buchanan MRN: [id number] Date of Birth: 09/12/1946  [facility] of [facility]) CM/SW Contact:    Epifanio Lesches, RN Phone Number: 01/13/2024, 12:41 PM  Clinical Narrative:                   - s/p L TKA, 4/9 NCM spoke with pt regarding d/c planning. Pt states PTA independent with ADL's, no DME usage. Plan: up with therapy, d/c tomorrow per provider Wife to assist with care once discharge.  Home health PT services will be provided by [facility] (prearranged by provider). Referral for DME , RW and BSC made with April/ [employer] . Equipment will be delivered to bedside prior to d/c. Pt without RX med concerns. Wife to provide transportation to home once d/c ready. Post hospital f/u noted on AVS. TOC team following and will assist with needs...  Expected Discharge Plan: Home w Home Health Services Barriers to Discharge: Continued Medical Work up   Patient Goals and CMS Choice     Choice offered to / list presented to : Patient      Expected Discharge Plan and Services   Discharge Planning Services: CM Consult   Living arrangements for the past 2 months: Single Family Home                 DME Arranged: Bedside commode, Walker rolling   Date DME Agency Contacted: 01/13/24 Time DME Agency Contacted: 1213   HH Arranged: PT HH Agency: [facility] Date HH Agency Contacted: 01/13/24 Time HH Agency Contacted: 1239    Prior Living Arrangements/Services Living arrangements for the past 2 months: Single Family Home Lives with:: Spouse Patient language and need for interpreter reviewed:: Yes Do you feel safe going back to the place where you live?: Yes      Need for Family Participation in Patient Care: Yes (Comment) Care giver support system in place?: Yes (comment)   Criminal Activity/Legal Involvement Pertinent to Current Situation/Hospitalization: No - Comment as  needed  Activities of Daily Living   ADL Screening (condition at time of admission) Independently performs ADLs?: No Is the patient deaf or have difficulty hearing?: Yes Does the patient have difficulty seeing, even when wearing glasses/contacts?: No Does the patient have difficulty concentrating, remembering, or making decisions?: No  Permission Sought/Granted                  Emotional Assessment       Orientation: : Oriented to Self, Oriented to Place, Oriented to  Time, Oriented to Situation Alcohol / Substance Use: Not Applicable Psych Involvement: No (comment)  Admission diagnosis:  Primary osteoarthritis of left knee [M17.12] OA (osteoarthritis) of knee [M17.9] 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] Patient Active Problem List   Diagnosis Date Noted   OA (osteoarthritis) of knee 01/12/2024   Status post total left knee replacement 01/12/2024   Unilateral primary osteoarthritis, left knee 02/17/2022   Unilateral primary osteoarthritis, right knee 02/17/2022   Essential hypertension 07/17/2020   Hyperlipidemia 07/17/2020   Tinnitus 07/17/2020   PCP:  Knox Royalty, MD Pharmacy:   [facility] [phone] - [place], [place] - [address] [place] [address] Phone: [phone] Fax: [phone]     Social Drivers of Health (SDOH) Social History: SDOH Screenings   Food Insecurity: No Food Insecurity (01/13/2024)  Housing: Unknown (01/13/2024)  Transportation Needs: No Transportation Needs (01/13/2024)  Utilities:  Not At Risk (01/13/2024)  Alcohol Screen: Low Risk  (09/17/2020)  Depression (PHQ2-9): Low Risk  (12/13/2020)  Tobacco Use: Low Risk  (01/08/2024)   SDOH Interventions:     Readmission Risk Interventions     No data to display

## 2024-01-13 NOTE — Progress Notes (Signed)
 PT Cancellation Note  Patient Details Name: Steve Buchanan MRN: [id number] DOB: 1946/07/03   Cancelled Treatment:    Reason Eval/Treat Not Completed: Other (comment) RN present, attempting to complete bladder scan. Will return after completed to mobilize pt.  Vickki Muff, PT, DPT   Acute Rehabilitation Department Office [phone] Secure Chat Communication Preferred    Ronnie Derby 01/13/2024, 2:45 PM

## 2024-01-13 NOTE — Progress Notes (Signed)
 Subjective: 1 Day Post-Op Procedure(s) (LRB): ARTHROPLASTY, KNEE, TOTAL (Left) Patient reports pain as moderate.    Objective: Vital signs in last 24 hours: Temp:  [97.8 F (36.6 C)-98.8 F (37.1 C)] 98.8 F (37.1 C) (04/09 0519) Pulse Rate:  [46-75] 75 (04/09 0519) Resp:  [13-20] 16 (04/09 0519) BP: (105-175)/(65-118) 132/90 (04/09 0519) SpO2:  [93 %-100 %] 97 % (04/09 0519) Weight:  [97.5 kg] (P) 97.5 kg (04/08 0939)  Intake/Output from previous day: 04/08 0701 - 04/09 0700 In: 1000 [I.V.:800; IV Piggyback:200] Out: 550 [Urine:550] Intake/Output this shift: No intake/output data recorded.  Recent Labs    01/12/24 1811 01/13/24 0606  HGB 14.3 13.9   Recent Labs    01/12/24 1811 01/13/24 0606  WBC 16.1* 13.2*  RBC 4.45 4.30  HCT 41.0 39.5  PLT 235 244   Recent Labs    01/12/24 1811 01/13/24 0606  NA 136 133*  K 4.3 3.8  CL 104 101  CO2 23 17*  BUN 12 16  CREATININE 0.71 0.81  GLUCOSE 116* 152*  CALCIUM 8.9 8.6*   No results for input(s): "LABPT", "INR" in the last 72 hours.  Sensation intact distally Intact pulses distally Dorsiflexion/Plantar flexion intact Incision: dressing C/D/I Compartment soft   Assessment/Plan: 1 Day Post-Op Procedure(s) (LRB): ARTHROPLASTY, KNEE, TOTAL (Left) Up with therapy      Kathryne Hitch 01/13/2024, 7:30 AM

## 2024-01-13 NOTE — Evaluation (Signed)
 Physical Therapy Evaluation Patient Details Name: Steve Buchanan MRN: [id number] DOB: 05/20/1946 Today's Date: 01/13/2024  History of Present Illness  The pt is a 78 yo male presenting 4/8 for L TKA. PMH includes: arthritis, HTN, HLD, appendectomy, meniscus repair, and obesity (BMI 32).  Clinical Impression  Pt in bed upon arrival of PT, agreeable to evaluation at this time. Prior to admission the pt was ambulating without use of DME, reports no recent falls, but limited recent activity and no exercise. The pt lives with a friend who can assist after d/c, and has some DME available. Pt needing CGA for bed mobility and transfers with use of RW, but was limited to ~50 ft ambulation at this time due to pain and fatigue in LLE. No overt buckling or LOB, but pt with increased sway and frequent standing rest breaks. Will return for further knee activation and ROM exercises, as well as stair training later today. Anticipate will be able to return home with assist when medically stable.       If plan is discharge home, recommend the following: A little help with walking and/or transfers;A little help with bathing/dressing/bathroom;Assistance with cooking/housework;Assist for transportation;Help with stairs or ramp for entrance   Can travel by private vehicle        Equipment Recommendations Rolling walker (2 wheels) (if pt does not have already)  Recommendations for Other Services       Functional Status Assessment Patient has had a recent decline in their functional status and demonstrates the ability to make significant improvements in function in a reasonable and predictable amount of time.     Precautions / Restrictions Precautions Precautions: Fall;Knee Precaution Booklet Issued: Yes (comment) Recall of Precautions/Restrictions: Intact Precaution/Restrictions Comments: knee in extension, no pillow under knee Required Braces or Orthoses: Knee Immobilizer - Left Restrictions Weight Bearing  Restrictions Per Provider Order: Yes LLE Weight Bearing Per Provider Order: Weight bearing as tolerated Other Position/Activity Restrictions: knee in extension, no pillow under knee      Mobility  Bed Mobility Overal bed mobility: Needs Assistance Bed Mobility: Supine to Sit     Supine to sit: HOB elevated, Used rails     General bed mobility comments: no assist to LLE, pt using bed rails    Transfers Overall transfer level: Needs assistance Equipment used: Rolling walker (2 wheels) Transfers: Sit to/from Stand, Bed to chair/wheelchair/BSC Sit to Stand: Min assist   Step pivot transfers: Contact guard assist       General transfer comment: minA to power up to standing, CGA for safety while pivoting    Ambulation/Gait Ambulation/Gait assistance: Contact guard assist Gait Distance (Feet): 50 Feet Assistive device: Rolling walker (2 wheels) Gait Pattern/deviations: Step-to pattern, Decreased stance time - left, Decreased weight shift to left Gait velocity: decreased Gait velocity interpretation: <1.31 ft/sec, indicative of household ambulator Pre-gait activities: standing wt shift General Gait Details: slowed, step-to pattern progressively more wt through UE with LLE fatigue. increased A-P sway     Balance Overall balance assessment: Needs assistance Sitting-balance support: No upper extremity supported, Feet supported Sitting balance-Leahy Scale: Good     Standing balance support: Bilateral upper extremity supported, During functional activity Standing balance-Leahy Scale: Poor Standing balance comment: dependent on BUE support                             Pertinent Vitals/Pain Pain Assessment Pain Assessment: 0-10 Pain Score: 7  Pain Location: L knee Pain  Descriptors / Indicators: Aching, Grimacing, Discomfort, Operative site guarding, Sore Pain Intervention(s): Monitored during session, Limited activity within patient's tolerance, Premedicated  before session, Repositioned, Ice applied    Home Living Family/patient expects to be discharged to:: Private residence Living Arrangements: Spouse/significant other Available Help at Discharge: Family;Available 24 hours/day Type of Home: House Home Access: Stairs to enter Entrance Stairs-Rails: None Entrance Stairs-Number of Steps: 1 Alternate Level Stairs-Number of Steps: flight, but has stair lift Home Layout: Multi-level;Bed/bath upstairs;Able to live on main level with bedroom/bathroom;1/2 bath on main level (can stay in living room,) Home Equipment: Cane - single point;Standard Walker;Shower seat - built in;Grab bars - tub/shower;Grab bars - toilet;Hand held shower head;Electric scooter Additional Comments: has 6 dogs and a feral cat    Prior Function Prior Level of Function : Independent/Modified Independent;Driving             Mobility Comments: uses walking stick outside, no DME in the home before surgery. pt reports not using stair lift, no falls, no exercise or activity in last 9 months ADLs Comments: independent     Extremity/Trunk Assessment   Upper Extremity Assessment Upper Extremity Assessment: Overall WFL for tasks assessed    Lower Extremity Assessment Lower Extremity Assessment: LLE deficits/detail LLE Deficits / Details: limited knee ROM due to pain and swelling, 10-80deg flexion. no numbness, grossly 4/5 at ankle, 3/5 at hip and 2/5 at knee LLE Sensation: WNL LLE Coordination: WNL    Cervical / Trunk Assessment Cervical / Trunk Assessment: Other exceptions Cervical / Trunk Exceptions: large body habitus  Communication   Communication Communication: No apparent difficulties Factors Affecting Communication: Hearing impaired (hearing aids)    Cognition Arousal: Alert Behavior During Therapy: WFL for tasks assessed/performed   PT - Cognitive impairments: No apparent impairments                         Following commands: Intact        Cueing Cueing Techniques: Verbal cues     General Comments General comments (skin integrity, edema, etc.): VSS on RA    Exercises Total Joint Exercises Ankle Circles/Pumps: AROM, Both, 10 reps Quad Sets: AROM, Left, 10 reps Heel Slides: AAROM, Left, 5 reps, Supine Hip ABduction/ADduction: AAROM, Left, 10 reps, Supine Straight Leg Raises: AAROM, Left, 5 reps, Supine Knee Flexion: AROM, Left, 5 reps, Seated Goniometric ROM: 10-85   Assessment/Plan    PT Assessment Patient needs continued PT services  PT Problem List Decreased strength;Decreased range of motion;Decreased activity tolerance;Decreased balance;Decreased mobility;Pain       PT Treatment Interventions DME instruction;Gait training;Stair training;Functional mobility training;Therapeutic activities;Therapeutic exercise;Balance training;Patient/family education    PT Goals (Current goals can be found in the Care Plan section)  Acute Rehab PT Goals Patient Stated Goal: return home PT Goal Formulation: With patient Time For Goal Achievement: 01/27/24 Potential to Achieve Goals: Good    Frequency 7X/week        AM-PAC PT "6 Clicks" Mobility  Outcome Measure Help needed turning from your back to your side while in a flat bed without using bedrails?: A Little Help needed moving from lying on your back to sitting on the side of a flat bed without using bedrails?: A Little Help needed moving to and from a bed to a chair (including a wheelchair)?: A Little Help needed standing up from a chair using your arms (e.g., wheelchair or bedside chair)?: A Little Help needed to walk in hospital room?: A Little Help needed  climbing 3-5 steps with a railing? : A Lot 6 Click Score: 17    End of Session Equipment Utilized During Treatment: Gait belt;Left knee immobilizer Activity Tolerance: Patient tolerated treatment well Patient left: in chair;with call bell/phone within reach;with chair alarm set Nurse Communication: Mobility  status PT Visit Diagnosis: Unsteadiness on feet (R26.81);Pain;Muscle weakness (generalized) (M62.81) Pain - Right/Left: Left Pain - part of body: Knee    Time: 0454-0981 PT Time Calculation (min) (ACUTE ONLY): 38 min   Charges:   PT Evaluation $PT Eval Moderate Complexity: 1 Mod PT Treatments $Gait Training: 8-22 mins $Therapeutic Exercise: 8-22 mins PT General Charges $$ ACUTE PT VISIT: 1 Visit         Vickki Muff, PT, DPT   Acute Rehabilitation Department Office [phone] Secure Chat Communication Preferred  Ronnie Derby 01/13/2024, 10:47 AM

## 2024-01-13 NOTE — Plan of Care (Signed)
  Problem: Education: Goal: Knowledge of General Education information will improve Description: Including pain rating scale, medication(s)/side effects and non-pharmacologic comfort measures Outcome: Progressing   Problem: Health Behavior/Discharge Planning: Goal: Ability to manage health-related needs will improve Outcome: Progressing   Problem: Clinical Measurements: Goal: Ability to maintain clinical measurements within normal limits will improve Outcome: Progressing Goal: Will remain free from infection Outcome: Progressing Goal: Diagnostic test results will improve Outcome: Progressing Goal: Respiratory complications will improve Outcome: Progressing Goal: Cardiovascular complication will be avoided Outcome: Progressing   Problem: Activity: Goal: Risk for activity intolerance will decrease Outcome: Progressing   Problem: Nutrition: Goal: Adequate nutrition will be maintained Outcome: Progressing   Problem: Coping: Goal: Level of anxiety will decrease Outcome: Progressing   Problem: Elimination: Goal: Will not experience complications related to bowel motility Outcome: Progressing Goal: Will not experience complications related to urinary retention Outcome: Progressing   Problem: Pain Managment: Goal: General experience of comfort will improve and/or be controlled Outcome: Progressing   Problem: Safety: Goal: Ability to remain free from injury will improve Outcome: Progressing   Problem: Education: Goal: Knowledge of the prescribed therapeutic regimen will improve Outcome: Progressing Goal: Individualized Educational Video(s) Outcome: Progressing   Problem: Activity: Goal: Ability to avoid complications of mobility impairment will improve Outcome: Progressing Goal: Range of joint motion will improve Outcome: Progressing   Problem: Clinical Measurements: Goal: Postoperative complications will be avoided or minimized Outcome: Progressing   Problem:  Pain Management: Goal: Pain level will decrease with appropriate interventions Outcome: Progressing   Problem: Skin Integrity: Goal: Will show signs of wound healing Outcome: Progressing

## 2024-01-13 NOTE — Progress Notes (Signed)
 Physical Therapy Treatment Patient Details Name: Steve Buchanan MRN: [id number] DOB: Mar 03, 1946 Today's Date: 01/13/2024   History of Present Illness The pt is a 78 yo male presenting 4/8 for L TKA. PMH includes: arthritis, HTN, HLD, appendectomy, meniscus repair, and obesity (BMI 32).    PT Comments  The pt was agreeable to session, reports pain remains under control this afternoon. The pt continues to demo great active ROM at L knee, and was able to progress ambulation distance with good stability and no buckling in LLE. He does require frequent standing rest breaks and cues to rest rather than rush when fatigued. Pt able to tolerate sitting ROM exercises with LLE with cues to perform slow PLB with knee movement. Will continue to follow to progress mobility and LLE activation, continue to anticipate pt will be able to return home with family support.    If plan is discharge home, recommend the following: A little help with walking and/or transfers;A little help with bathing/dressing/bathroom;Assistance with cooking/housework;Assist for transportation;Help with stairs or ramp for entrance   Can travel by private vehicle        Equipment Recommendations  Rolling walker (2 wheels) (if pt does not have already)    Recommendations for Other Services       Precautions / Restrictions Precautions Precautions: Fall;Knee Precaution Booklet Issued: Yes (comment) Recall of Precautions/Restrictions: Intact Precaution/Restrictions Comments: knee in extension, no pillow under knee Required Braces or Orthoses: Knee Immobilizer - Left Restrictions Weight Bearing Restrictions Per Provider Order: Yes LLE Weight Bearing Per Provider Order: Weight bearing as tolerated Other Position/Activity Restrictions: knee in extension, no pillow under knee     Mobility  Bed Mobility Overal bed mobility: Needs Assistance Bed Mobility: Sit to Supine     Supine to sit: HOB elevated, Used rails Sit to supine:  Supervision, Used rails   General bed mobility comments: no assist, cues to use bridging to reposition in bed    Transfers Overall transfer level: Needs assistance Equipment used: Rolling walker (2 wheels) Transfers: Sit to/from Stand, Bed to chair/wheelchair/BSC Sit to Stand: Min assist   Step pivot transfers: Contact guard assist       General transfer comment: minA to power up to standing, CGA for safety while pivoting    Ambulation/Gait Ambulation/Gait assistance: Contact guard assist Gait Distance (Feet): 75 Feet Assistive device: Rolling walker (2 wheels) Gait Pattern/deviations: Step-to pattern, Decreased stance time - left, Decreased weight shift to left Gait velocity: decreased Gait velocity interpretation: <1.31 ft/sec, indicative of household ambulator Pre-gait activities: standing wt shift General Gait Details: slowed, step-to pattern progressively more wt through UE with LLE fatigue. increased A-P sway, frequent standing restbreaks   Stairs Stairs:  (discussed verbally, pt only has a threshold to enter his home, then stairlift to upstairs)              Balance Overall balance assessment: Needs assistance Sitting-balance support: No upper extremity supported, Feet supported Sitting balance-Leahy Scale: Good     Standing balance support: Bilateral upper extremity supported, During functional activity Standing balance-Leahy Scale: Poor Standing balance comment: dependent on BUE support                            Communication Communication Communication: No apparent difficulties Factors Affecting Communication: Hearing impaired (hearing aids)  Cognition Arousal: Alert Behavior During Therapy: WFL for tasks assessed/performed   PT - Cognitive impairments: No apparent impairments  Following commands: Intact      Cueing Cueing Techniques: Verbal cues  Exercises Total Joint Exercises Ankle  Circles/Pumps: AROM, Both, 10 reps Quad Sets: AROM, Left, 10 reps Heel Slides: AAROM, Left, 5 reps, Supine Hip ABduction/ADduction: AAROM, Left, 10 reps, Supine Straight Leg Raises: AAROM, Left, 5 reps, Supine Long Arc Quad: AROM, Left, 5 reps, Seated Knee Flexion: AROM, Left, 5 reps, Seated Goniometric ROM: 10-85 General Exercises - Lower Extremity Toe Raises: AROM, Both, 15 reps, Seated Heel Raises: AROM, Both, 15 reps, Seated    General Comments General comments (skin integrity, edema, etc.): VSS on RA      Pertinent Vitals/Pain Pain Assessment Pain Assessment: 0-10 Pain Score: 7  Pain Location: L knee Pain Descriptors / Indicators: Aching, Grimacing, Discomfort, Operative site guarding, Sore Pain Intervention(s): Monitored during session, Limited activity within patient's tolerance, Premedicated before session, Repositioned, Ice applied     PT Goals (current goals can now be found in the care plan section) Acute Rehab PT Goals Patient Stated Goal: return home PT Goal Formulation: With patient Time For Goal Achievement: 01/27/24 Potential to Achieve Goals: Good Progress towards PT goals: Progressing toward goals    Frequency    7X/week       AM-PAC PT "6 Clicks" Mobility   Outcome Measure  Help needed turning from your back to your side while in a flat bed without using bedrails?: A Little Help needed moving from lying on your back to sitting on the side of a flat bed without using bedrails?: A Little Help needed moving to and from a bed to a chair (including a wheelchair)?: A Little Help needed standing up from a chair using your arms (e.g., wheelchair or bedside chair)?: A Little Help needed to walk in hospital room?: A Little Help needed climbing 3-5 steps with a railing? : A Lot 6 Click Score: 17    End of Session Equipment Utilized During Treatment: Gait belt;Left knee immobilizer Activity Tolerance: Patient tolerated treatment well Patient left: with  call bell/phone within reach;in bed;with bed alarm set Nurse Communication: Mobility status PT Visit Diagnosis: Unsteadiness on feet (R26.81);Pain;Muscle weakness (generalized) (M62.81) Pain - Right/Left: Left Pain - part of body: Knee     Time: 1456-1530 PT Time Calculation (min) (ACUTE ONLY): 34 min  Charges:    $Gait Training: 8-22 mins $Therapeutic Exercise: 8-22 mins PT General Charges $$ ACUTE PT VISIT: 1 Visit                     Vickki Muff, PT, DPT   Acute Rehabilitation Department Office [phone] Secure Chat Communication Preferred   Ronnie Derby 01/13/2024, 4:07 PM

## 2024-01-13 NOTE — Care Management Obs Status (Signed)
 MEDICARE OBSERVATION STATUS NOTIFICATION   Patient Details  Name: Steve Buchanan MRN: [id number] Date of Birth: July 26, 1946   Medicare Observation Status Notification Given:  Yes    Epifanio Lesches, RN 01/13/2024, 2:22 PM

## 2024-01-14 ENCOUNTER — Other Ambulatory Visit: Payer: Self-pay

## 2024-01-14 DIAGNOSIS — M1712 Unilateral primary osteoarthritis, left knee: Secondary | ICD-10-CM | POA: Diagnosis not present

## 2024-01-14 MED ORDER — ASPIRIN 81 MG PO CHEW: 81.0000 mg | CHEWABLE_TABLET | Freq: Two times a day (BID) | ORAL | 0 refills | Status: AC

## 2024-01-14 MED ORDER — OXYCODONE HCL 5 MG PO TABS
5.0000 mg | ORAL_TABLET | ORAL | 0 refills | Status: DC | PRN
Start: 2024-01-14 — End: 2024-01-20

## 2024-01-14 MED ORDER — METHOCARBAMOL 500 MG PO TABS: 500.0000 mg | ORAL_TABLET | Freq: Four times a day (QID) | ORAL | 1 refills | Status: DC | PRN

## 2024-01-14 NOTE — Progress Notes (Signed)
 Physical Therapy Treatment Patient Details Name: Emit Kuenzel MRN: [id number] DOB: [birth year] Today's Date: 01/14/2024   History of Present Illness The pt is a 78 yo male presenting 4/8 for L TKA. PMH includes: arthritis, HTN, HLD, appendectomy, meniscus repair, and obesity (BMI 32).    PT Comments  Patient resting in recliner, dressed in street clothes and agreeable to participate. Pt completed sit<>stand with bil UE for power up, CGA for safety. Pt amb ~80' with RW and completed single step negotiation with RW and cues for sequencing. No LOB noted and pt's friend present and verbalized understanding of safe assist to provide. EOS reviewed HEP and addressed questions for return home. Pt is safe to return home with assist from friend. Will continue to progress as able during acute stay.   If plan is discharge home, recommend the following: A little help with walking and/or transfers;A little help with bathing/dressing/bathroom;Assistance with cooking/housework;Assist for transportation;Help with stairs or ramp for entrance   Can travel by private vehicle        Equipment Recommendations  Rolling walker (2 wheels)    Recommendations for Other Services       Precautions / Restrictions Precautions Precautions: Fall;Knee Recall of Precautions/Restrictions: Intact Precaution/Restrictions Comments: knee in extension, no pillow under knee Required Braces or Orthoses: Knee Immobilizer - Left Restrictions Weight Bearing Restrictions Per Provider Order: Yes LLE Weight Bearing Per Provider Order: Weight bearing as tolerated Other Position/Activity Restrictions: knee in extension, no pillow under knee     Mobility  Bed Mobility Overal bed mobility: Needs Assistance Bed Mobility: Supine to Sit     Supine to sit: HOB elevated, Used rails, Supervision     General bed mobility comments: pt OOB in recliner    Transfers Overall transfer level: Needs assistance Equipment used: Rolling  walker (2 wheels) Transfers: Sit to/from Stand Sit to Stand: Contact guard assist           General transfer comment: CGA for power up from recliner, pt reliant on bil UE to rise and control lower    Ambulation/Gait Ambulation/Gait assistance: Contact guard assist Gait Distance (Feet): 80 Feet Assistive device: Rolling walker (2 wheels) Gait Pattern/deviations: Step-to pattern, Decreased stance time - left, Decreased weight shift to left, Trunk flexed Gait velocity: decr     General Gait Details: good carryover for walker management, min cues to adjuste step pattern slightly for improved use of UE's to support Lt LE.   Stairs Stairs: Yes Stairs assistance: Contact guard assist Stair Management: No rails, Backwards, With walker Number of Stairs: 1 General stair comments: cues for reverse step up with RW.   Wheelchair Mobility     Tilt Bed    Modified Rankin (Stroke Patients Only)       Balance Overall balance assessment: Needs assistance Sitting-balance support: No upper extremity supported, Feet supported Sitting balance-Leahy Scale: Good     Standing balance support: Bilateral upper extremity supported, No upper extremity supported, During functional activity Standing balance-Leahy Scale: Fair Standing balance comment: relies on RW dyanimcally but able to engage in ADLs without UE support                            Communication Communication Communication: No apparent difficulties Factors Affecting Communication: Hearing impaired (hearing aides)  Cognition Arousal: Alert Behavior During Therapy: WFL for tasks assessed/performed   PT - Cognitive impairments: No apparent impairments  Following commands: Intact      Cueing Cueing Techniques: Verbal cues  Exercises Total Joint Exercises Ankle Circles/Pumps: AROM, Both, 10 reps Quad Sets: AROM, Left, 5 reps Short Arc Quad: AROM, Left, 5 reps Heel Slides:  AAROM, Left, 5 reps, Supine Hip ABduction/ADduction: AROM, Left, 5 reps Straight Leg Raises: AROM, Left, 5 reps Knee Flexion: AROM, Left, 5 reps, Other reps (comment), Limitations Knee Flexion Limitations: 2 reps    General Comments        Pertinent Vitals/Pain Pain Assessment Pain Assessment: 0-10 Pain Score: 5  Pain Location: L knee Pain Descriptors / Indicators: Aching, Grimacing, Discomfort, Operative site guarding, Sore Pain Intervention(s): Limited activity within patient's tolerance, Monitored during session, Repositioned, Patient requesting pain meds-RN notified, RN gave pain meds during session    Home Living                          Prior Function            PT Goals (current goals can now be found in the care plan section) Acute Rehab PT Goals Patient Stated Goal: return home PT Goal Formulation: With patient Time For Goal Achievement: 01/27/24 Potential to Achieve Goals: Good Progress towards PT goals: Progressing toward goals    Frequency    7X/week      PT Plan      Co-evaluation              AM-PAC PT "6 Clicks" Mobility   Outcome Measure  Help needed turning from your back to your side while in a flat bed without using bedrails?: A Little Help needed moving from lying on your back to sitting on the side of a flat bed without using bedrails?: A Little Help needed moving to and from a bed to a chair (including a wheelchair)?: A Little Help needed standing up from a chair using your arms (e.g., wheelchair or bedside chair)?: A Little Help needed to walk in hospital room?: A Little Help needed climbing 3-5 steps with a railing? : A Little 6 Click Score: 18    End of Session Equipment Utilized During Treatment: Gait belt Activity Tolerance: Patient tolerated treatment well Patient left: with call bell/phone within reach;in bed;with bed alarm set Nurse Communication: Mobility status PT Visit Diagnosis: Unsteadiness on feet  (R26.81);Pain;Muscle weakness (generalized) (M62.81) Pain - Right/Left: Left Pain - part of body: Knee     Time: 1350-1419 PT Time Calculation (min) (ACUTE ONLY): 29 min  Charges:    $Gait Training: 8-22 mins $Therapeutic Exercise: 8-22 mins PT General Charges $$ ACUTE PT VISIT: 1 Visit                     Wynn Maudlin, DPT Acute Rehabilitation Services Office [phone]  01/14/24 3:40 PM

## 2024-01-14 NOTE — Plan of Care (Signed)

## 2024-01-14 NOTE — Discharge Summary (Signed)
 Patient ID: Steve Buchanan MRN: [id number] DOB/AGE: [birth year] 76 y.o.  Admit date: 01/12/2024 Discharge date: 01/14/2024  Admission Diagnoses:  Principal Problem:   Unilateral primary osteoarthritis, left knee Active Problems:   OA (osteoarthritis) of knee   Status post total left knee replacement   Discharge Diagnoses:  Same  No past medical history on file.  Surgeries: Procedure(s): ARTHROPLASTY, KNEE, TOTAL on 01/12/2024   Consultants:   Discharged Condition: Improved  Hospital Course: Steve Buchanan is an [age] y.o. male who was admitted 01/12/2024 for operative treatment ofUnilateral primary osteoarthritis, left knee. Patient has severe unremitting pain that affects sleep, daily activities, and work/hobbies. After pre-op clearance the patient was taken to the operating room on 01/12/2024 and underwent  Procedure(s): ARTHROPLASTY, KNEE, TOTAL.    Patient was given perioperative antibiotics:  Anti-infectives (From admission, onward)    Start     Dose/Rate Route Frequency Ordered Stop   01/12/24 1800  ceFAZolin (ANCEF) IVPB 2g/100 mL premix        2 g 200 mL/hr over 30 Minutes Intravenous Every 6 hours 01/12/24 1626 01/13/24 0216   01/12/24 1000  ceFAZolin (ANCEF) IVPB 2g/100 mL premix        2 g 200 mL/hr over 30 Minutes Intravenous On call to O.R. 01/12/24 6213 01/12/24 1223        Patient was given sequential compression devices, early ambulation, and chemoprophylaxis to prevent DVT.  Patient benefited maximally from hospital stay and there were no complications.    Recent vital signs: Patient Vitals for the past 24 hrs:  BP Temp Temp src Pulse Resp SpO2  01/14/24 0722 (!) 122/55 98.3 F (36.8 C) Oral 86 -- 95 %  01/14/24 0449 116/83 98.1 F (36.7 C) Oral 88 18 95 %  01/13/24 2127 116/61 98.2 F (36.8 C) Oral 69 18 96 %  01/13/24 1624 123/78 (!) 97.5 F (36.4 C) Oral 80 -- 91 %     Recent laboratory studies:  Recent Labs    01/12/24 1811 01/13/24 0606  WBC  16.1* 13.2*  HGB 14.3 13.9  HCT 41.0 39.5  PLT 235 244  NA 136 133*  K 4.3 3.8  CL 104 101  CO2 23 17*  BUN 12 16  CREATININE 0.71 0.81  GLUCOSE 116* 152*  CALCIUM 8.9 8.6*     Discharge Medications:   Allergies as of 01/14/2024   No Known Allergies      Medication List     STOP taking these medications    aspirin 325 MG tablet Replaced by: aspirin 81 MG chewable tablet       TAKE these medications    aspirin 81 MG chewable tablet Chew 1 tablet (81 mg total) by mouth 2 (two) times daily. Replaces: aspirin 325 MG tablet   atorvastatin 40 MG tablet Commonly known as: LIPITOR Take 40 mg by mouth at bedtime.   methocarbamol 500 MG tablet Commonly known as: ROBAXIN Take 1 tablet (500 mg total) by mouth every 6 (six) hours as needed for muscle spasms.   oxyCODONE 5 MG immediate release tablet Commonly known as: Oxy IR/ROXICODONE Take 1-2 tablets (5-10 mg total) by mouth every 4 (four) hours as needed for moderate pain (pain score 4-6) (pain score 4-6).   Vitamin D (Ergocalciferol) 1.25 MG (50000 UNIT) Caps capsule Commonly known as: DRISDOL Take 50,000 Units by mouth every Sunday at 6pm.               Durable Medical Equipment  (From admission, onward)  Start     Ordered   01/13/24 1211  DME 3 n 1  Once       Comments: Bedside commode , confine to one room   01/13/24 1211   01/12/24 1627  DME Walker rolling  Once       Question Answer Comment  Walker: With 5 Inch Wheels   Patient needs a walker to treat with the following condition Status post total left knee replacement      01/12/24 1626            Diagnostic Studies: DG Knee Left Port Result Date: 01/13/2024 CLINICAL DATA:  Status post total knee arthroplasty EXAM: PORTABLE LEFT KNEE - 1-2 VIEW COMPARISON:  10/08/2023 FINDINGS: Left knee arthroplasty. No periprosthetic fracture. Intra-articular gas. Anterior staples. IMPRESSION: Expected appearance after left knee  arthroplasty. Electronically Signed   By: Jeronimo Greaves M.D.   On: 01/13/2024 11:34    Disposition: Discharge disposition: 06-Home-Health Care Svc          Follow-up Information     Kathryne Hitch, MD Follow up in 2 week(s).   Specialty: Orthopedic Surgery Contact information: [address] [place] [address] [phone]         Knox Royalty, MD Follow up.   Specialty: Family Medicine Contact information: [address] [place] [address] [phone]         [facility] Follow up.   Specialty: Home Health Services Why: home health services will be provied by [facility] , start of care within 48 hours post discharge Contact information: [address] [place] [address] [phone]                  Signed: Richardean Canal 01/14/2024, 10:48 AM

## 2024-01-14 NOTE — Plan of Care (Signed)
  Problem: Activity: Goal: Risk for activity intolerance will decrease Outcome: Progressing   Problem: Pain Managment: Goal: General experience of comfort will improve and/or be controlled Outcome: Progressing   Problem: Safety: Goal: Ability to remain free from injury will improve Outcome: Progressing   Problem: Skin Integrity: Goal: Risk for impaired skin integrity will decrease Outcome: Progressing

## 2024-01-14 NOTE — Evaluation (Signed)
 Occupational Therapy Evaluation Patient Details Name: Steve Buchanan MRN: [id number] DOB: 1946/02/08 Today's Date: 01/14/2024   History of Present Illness   The pt is a 78 yo male presenting 4/8 for L TKA. PMH includes: arthritis, HTN, HLD, appendectomy, meniscus repair, and obesity (BMI 32).     Clinical Impressions PTA patient independent with ADLs, IADls.  Admitted for above and presents with problem list below. He requires min assist for LB Adls, min guard for grooming standing at sink, and min guard for transfers/functional mobility in room using RW.  Educated on AE for LB bathing, techniques for shower transfers. Pt with no further questions or concerns.  Will follow acutely but no follow up needed after dc home.      If plan is discharge home, recommend the following:   A little help with walking and/or transfers;A little help with bathing/dressing/bathroom;Assistance with cooking/housework     Functional Status Assessment   Patient has had a recent decline in their functional status and demonstrates the ability to make significant improvements in function in a reasonable and predictable amount of time.     Equipment Recommendations   BSC/3in1     Recommendations for Other Services         Precautions/Restrictions   Precautions Precautions: Fall;Knee Recall of Precautions/Restrictions: Intact Precaution/Restrictions Comments: knee in extension, no pillow under knee Required Braces or Orthoses: Knee Immobilizer - Left Restrictions Weight Bearing Restrictions Per Provider Order: Yes LLE Weight Bearing Per Provider Order: Weight bearing as tolerated Other Position/Activity Restrictions: knee in extension, no pillow under knee     Mobility Bed Mobility               General bed mobility comments: OOB in recliner    Transfers Overall transfer level: Needs assistance Equipment used: Rolling walker (2 wheels) Transfers: Sit to/from Stand Sit to  Stand: Contact guard assist           General transfer comment: min guard for safety, good hand placement      Balance Overall balance assessment: Needs assistance Sitting-balance support: No upper extremity supported, Feet supported Sitting balance-Leahy Scale: Good     Standing balance support: Bilateral upper extremity supported, No upper extremity supported, During functional activity Standing balance-Leahy Scale: Fair Standing balance comment: relies on RW dyanimcally but able to engage in ADLs without UE support                           ADL either performed or assessed with clinical judgement   ADL Overall ADL's : Needs assistance/impaired     Grooming: Supervision/safety;Standing;Wash/dry hands;Oral care Grooming Details (indicate cue type and reason): as sink         Upper Body Dressing : Set up;Sitting   Lower Body Dressing: Minimal assistance;Sit to/from stand Lower Body Dressing Details (indicate cue type and reason): L LE clothing mgmt, ex wife will assist at dc. Toilet Transfer: Contact guard assist;Ambulation;Rolling walker (2 wheels)   Toileting- Clothing Manipulation and Hygiene: Contact guard assist;Sit to/from Nurse, children's Details (indicate cue type and reason): min guard for safety, educated on technique using RW or grabbars. exwife will assist pt Functional mobility during ADLs: Contact guard assist;Rolling walker (2 wheels) General ADL Comments: educated on AE for LB bathing, exwife plans to assist with LB dressing and iADLs     Vision         Perception  Praxis         Pertinent Vitals/Pain Pain Assessment Pain Assessment: 0-10 Pain Score: 3  Pain Location: L knee Pain Descriptors / Indicators: Aching, Grimacing, Discomfort, Operative site guarding, Sore Pain Intervention(s): Limited activity within patient's tolerance, Monitored during session, Premedicated before session, Repositioned      Extremity/Trunk Assessment Upper Extremity Assessment Upper Extremity Assessment: Overall WFL for tasks assessed   Lower Extremity Assessment Lower Extremity Assessment: Defer to PT evaluation   Cervical / Trunk Assessment Cervical / Trunk Assessment: Other exceptions Cervical / Trunk Exceptions: large body habitus   Communication Communication Communication: No apparent difficulties Factors Affecting Communication: Hearing impaired (hearing aides)   Cognition Arousal: Alert Behavior During Therapy: WFL for tasks assessed/performed Cognition: No apparent impairments                               Following commands: Intact       Cueing  General Comments   Cueing Techniques: Verbal cues  ex wife present at end of session   Exercises     Shoulder Instructions      Home Living Family/patient expects to be discharged to:: Private residence Living Arrangements: Spouse/significant other (ex wife) Available Help at Discharge: Family;Available 24 hours/day Type of Home: House Home Access: Stairs to enter [employer] of Steps: 1 Entrance Stairs-Rails: None Home Layout: Multi-level;Bed/bath upstairs;Able to live on main level with bedroom/bathroom;1/2 bath on main level Alternate Level Stairs-Number of Steps: flight, but has stair lift   Bathroom Shower/Tub: Walk-in shower   Bathroom Toilet: Handicapped height Bathroom Accessibility: Yes   Home Equipment: Cane - single point;Standard Environmental consultant;Shower seat - built in;Grab bars - tub/shower;Grab bars - toilet;Hand held shower head;Electric scooter   Additional Comments: has 6 dogs and a feral cat      Prior Functioning/Environment Prior Level of Function : Independent/Modified Independent;Driving             Mobility Comments: uses walking stick outside, no DME in the home before surgery. pt reports not using stair lift, no falls, no exercise or activity in last 9 months ADLs Comments:  independent, manages IADLs    OT Problem List: Decreased strength;Decreased activity tolerance;Impaired balance (sitting and/or standing);Pain;Obesity;Decreased knowledge of precautions;Decreased knowledge of use of DME or AE   OT Treatment/Interventions: Self-care/ADL training;Therapeutic exercise;DME and/or AE instruction;Therapeutic activities;Patient/family education;Balance training      OT Goals(Current goals can be found in the care plan section)   Acute Rehab OT Goals Patient Stated Goal: home OT Goal Formulation: With patient Time For Goal Achievement: 01/28/24 Potential to Achieve Goals: Good   OT Frequency:  Min 2X/week    Co-evaluation              AM-PAC OT "6 Clicks" Daily Activity     Outcome Measure Help from another person eating meals?: None Help from another person taking care of personal grooming?: A Little Help from another person toileting, which includes using toliet, bedpan, or urinal?: A Little Help from another person bathing (including washing, rinsing, drying)?: A Little Help from another person to put on and taking off regular upper body clothing?: A Little Help from another person to put on and taking off regular lower body clothing?: A Little 6 Click Score: 19   End of Session Equipment Utilized During Treatment: Rolling walker (2 wheels);Left knee immobilizer Nurse Communication: Mobility status;Precautions  Activity Tolerance: Patient tolerated treatment well Patient left: in chair;with call bell/phone  within reach;with chair alarm set;with family/visitor present  OT Visit Diagnosis: Other abnormalities of gait and mobility (R26.89);Pain Pain - Right/Left: Left Pain - part of body: Knee                Time: 1610-9604 OT Time Calculation (min): 28 min Charges:  OT General Charges $OT Visit: 1 Visit OT Evaluation $OT Eval Moderate Complexity: 1 Mod OT Treatments $Self Care/Home Management : 8-22 mins  Barry Brunner, OT Acute  Rehabilitation Services Office [phone] Secure Chat Preferred    Chancy Milroy 01/14/2024, 1:28 PM

## 2024-01-14 NOTE — Progress Notes (Signed)
 Subjective: 2 Days Post-Op Procedure(s) (LRB): ARTHROPLASTY, KNEE, TOTAL (Left) Patient reports pain as mild.  No complaints wants to discharge home today. Urinary retention has resolved.   Objective: Vital signs in last 24 hours: Temp:  [97.5 F (36.4 C)-98.3 F (36.8 C)] 98.3 F (36.8 C) (04/10 0722) Pulse Rate:  [69-88] 86 (04/10 0722) Resp:  [18] 18 (04/10 0449) BP: (116-123)/(55-83) 122/55 (04/10 0722) SpO2:  [91 %-96 %] 95 % (04/10 0722)  Intake/Output from previous day: 04/09 0701 - 04/10 0700 In: 120 [P.O.:120] Out: 1600 [Urine:1600] Intake/Output this shift: No intake/output data recorded.  Recent Labs    01/12/24 1811 01/13/24 0606  HGB 14.3 13.9   Recent Labs    01/12/24 1811 01/13/24 0606  WBC 16.1* 13.2*  RBC 4.45 4.30  HCT 41.0 39.5  PLT 235 244   Recent Labs    01/12/24 1811 01/13/24 0606  NA 136 133*  K 4.3 3.8  CL 104 101  CO2 23 17*  BUN 12 16  CREATININE 0.71 0.81  GLUCOSE 116* 152*  CALCIUM 8.9 8.6*   No results for input(s): "LABPT", "INR" in the last 72 hours.  Dorsiflexion/Plantar flexion intact Incision: scant drainage Compartment soft   Assessment/Plan: 2 Days Post-Op Procedure(s) (LRB): ARTHROPLASTY, KNEE, TOTAL (Left) Up with therapy Discharge home with home health if patient remains appropriate from PT and medical standpoint.       Steve Buchanan 01/14/2024, 10:43 AM

## 2024-01-14 NOTE — Progress Notes (Signed)
 AVS given and reviewed with pt. DME previously delivered to bedside. Medications discussed. All questions answered to satisfaction. Pt verbalized understanding of information given. Pt escorted off the unit with all belongings via wheelchair by staff member.

## 2024-01-14 NOTE — Anesthesia Postprocedure Evaluation (Signed)
 Anesthesia Post Note  Patient: Steve Buchanan  Procedure(s) Performed: ARTHROPLASTY, KNEE, TOTAL (Left: Knee)     Patient location during evaluation: PACU Anesthesia Type: Spinal Level of consciousness: awake and alert Pain management: pain level controlled Vital Signs Assessment: post-procedure vital signs reviewed and stable Respiratory status: spontaneous breathing and respiratory function stable Cardiovascular status: blood pressure returned to baseline and stable Postop Assessment: spinal receding Anesthetic complications: no   No notable events documented.  Last Vitals:  Vitals:   01/14/24 0449 01/14/24 0722  BP: 116/83 (!) 122/55  Pulse: 88 86  Resp: 18   Temp: 36.7 C 36.8 C  SpO2: 95% 95%    Last Pain:  Vitals:   01/14/24 0938  TempSrc:   PainSc: 7                  Kennieth Rad

## 2024-01-14 NOTE — Progress Notes (Signed)
 Physical Therapy Treatment Patient Details Name: Steve Buchanan MRN: [id number] DOB: 04-03-1946 Today's Date: 01/14/2024   History of Present Illness The pt is a 78 yo male presenting 4/8 for L TKA. PMH includes: arthritis, HTN, HLD, appendectomy, meniscus repair, and obesity (BMI 32).    PT Comments  Patient resting in bed and agreeable to mobilize with therapy, requesting pain medication and RN notified. Pt completed supine>sit with supervision, extra time and use of bed features. Pt ambulated ~80' with RW and safe step to pattern, no buckling or LOB noted. EOS pt agreeable to remain in recliner, reviewed exercises for Lt knee ROM and circulation. Will follow up for stair training and progress as able in acute setting.   If plan is discharge home, recommend the following: A little help with walking and/or transfers;A little help with bathing/dressing/bathroom;Assistance with cooking/housework;Assist for transportation;Help with stairs or ramp for entrance   Can travel by private vehicle        Equipment Recommendations  Rolling walker (2 wheels)    Recommendations for Other Services       Precautions / Restrictions Precautions Precautions: Fall;Knee Recall of Precautions/Restrictions: Intact Precaution/Restrictions Comments: knee in extension, no pillow under knee Required Braces or Orthoses: Knee Immobilizer - Left Restrictions Weight Bearing Restrictions Per Provider Order: Yes LLE Weight Bearing Per Provider Order: Weight bearing as tolerated Other Position/Activity Restrictions: knee in extension, no pillow under knee     Mobility  Bed Mobility Overal bed mobility: Needs Assistance Bed Mobility: Supine to Sit     Supine to sit: HOB elevated, Used rails, Supervision     General bed mobility comments: sup with pt using bed features and extra time    Transfers Overall transfer level: Needs assistance Equipment used: Rolling walker (2 wheels) Transfers: Sit to/from  Stand Sit to Stand: Min assist           General transfer comment: min assist to power up from low bed height, cues for hand placement.    Ambulation/Gait Ambulation/Gait assistance: Contact guard assist Gait Distance (Feet): 80 Feet Assistive device: Rolling walker (2 wheels) Gait Pattern/deviations: Step-to pattern, Decreased stance time - left, Decreased weight shift to left Gait velocity: decr     General Gait Details: cues at start to maintain position to RW, pt steady with step to pattern. no buckling at Lt LE. pt took 1 short standing break at ~65'.   Stairs             Wheelchair Mobility     Tilt Bed    Modified Rankin (Stroke Patients Only)       Balance Overall balance assessment: Needs assistance Sitting-balance support: No upper extremity supported, Feet supported Sitting balance-Leahy Scale: Good     Standing balance support: Bilateral upper extremity supported, No upper extremity supported, During functional activity Standing balance-Leahy Scale: Fair Standing balance comment: relies on RW dyanimcally but able to engage in ADLs without UE support                            Communication Communication Communication: No apparent difficulties Factors Affecting Communication: Hearing impaired (hearing aides)  Cognition Arousal: Alert Behavior During Therapy: WFL for tasks assessed/performed   PT - Cognitive impairments: No apparent impairments                         Following commands: Intact      Cueing Cueing Techniques: Verbal  cues  Exercises Total Joint Exercises Ankle Circles/Pumps: AROM, Both, 10 reps Quad Sets: AROM, Left, 5 reps Heel Slides: AAROM, Left, 5 reps, Supine    General Comments General comments (skin integrity, edema, etc.): ex wife present at end of session      Pertinent Vitals/Pain Pain Assessment Pain Assessment: 0-10 Pain Score: 7  Pain Location: L knee Pain Descriptors /  Indicators: Aching, Grimacing, Discomfort, Operative site guarding, Sore Pain Intervention(s): Limited activity within patient's tolerance, Monitored during session, Repositioned, Patient requesting pain meds-RN notified, RN gave pain meds during session    Home Living Family/patient expects to be discharged to:: Private residence Living Arrangements: Spouse/significant other (ex wife) Available Help at Discharge: Family;Available 24 hours/day Type of Home: House Home Access: Stairs to enter Entrance Stairs-Rails: None Entrance Stairs-Number of Steps: 1 Alternate Level Stairs-Number of Steps: flight, but has stair lift Home Layout: Multi-level;Bed/bath upstairs;Able to live on main level with bedroom/bathroom;1/2 bath on main level Home Equipment: Cane - single point;Standard Walker;Shower seat - built in;Grab bars - tub/shower;Grab bars - toilet;Hand held shower head;Electric scooter Additional Comments: has 6 dogs and a feral cat    Prior Function            PT Goals (current goals can now be found in the care plan section) Acute Rehab PT Goals Patient Stated Goal: return home PT Goal Formulation: With patient Time For Goal Achievement: 01/27/24 Potential to Achieve Goals: Good Progress towards PT goals: Progressing toward goals    Frequency    7X/week      PT Plan      Co-evaluation              AM-PAC PT "6 Clicks" Mobility   Outcome Measure  Help needed turning from your back to your side while in a flat bed without using bedrails?: A Little Help needed moving from lying on your back to sitting on the side of a flat bed without using bedrails?: A Little Help needed moving to and from a bed to a chair (including a wheelchair)?: A Little Help needed standing up from a chair using your arms (e.g., wheelchair or bedside chair)?: A Little Help needed to walk in hospital room?: A Little Help needed climbing 3-5 steps with a railing? : A Lot 6 Click Score: 17     End of Session Equipment Utilized During Treatment: Gait belt Activity Tolerance: Patient tolerated treatment well Patient left: with call bell/phone within reach;in bed;with bed alarm set Nurse Communication: Mobility status PT Visit Diagnosis: Unsteadiness on feet (R26.81);Pain;Muscle weakness (generalized) (M62.81) Pain - Right/Left: Left Pain - part of body: Knee     Time: 0925-0950 PT Time Calculation (min) (ACUTE ONLY): 25 min  Charges:    $Gait Training: 8-22 mins $Therapeutic Exercise: 8-22 mins PT General Charges $$ ACUTE PT VISIT: 1 Visit                     Wynn Maudlin, DPT Acute Rehabilitation Services Office [phone]  01/14/24 1:42 PM

## 2024-01-20 ENCOUNTER — Other Ambulatory Visit: Payer: Self-pay | Admitting: Orthopaedic Surgery

## 2024-01-20 ENCOUNTER — Telehealth: Payer: Self-pay | Admitting: Orthopaedic Surgery

## 2024-01-20 MED ORDER — OXYCODONE HCL 5 MG PO TABS
5.0000 mg | ORAL_TABLET | Freq: Four times a day (QID) | ORAL | 0 refills | Status: DC | PRN
Start: 2024-01-20 — End: 2024-03-09

## 2024-01-20 NOTE — Telephone Encounter (Signed)
 Patient called and needs a refill on Oxycodone. CB#[phone]

## 2024-01-25 ENCOUNTER — Encounter: Payer: Self-pay | Admitting: Orthopaedic Surgery

## 2024-01-25 ENCOUNTER — Ambulatory Visit (INDEPENDENT_AMBULATORY_CARE_PROVIDER_SITE_OTHER): Admitting: Orthopaedic Surgery

## 2024-01-25 ENCOUNTER — Telehealth: Payer: Self-pay

## 2024-01-25 DIAGNOSIS — Z96652 Presence of left artificial knee joint: Secondary | ICD-10-CM

## 2024-01-25 NOTE — Progress Notes (Signed)
 The patient is a 78 year old is here today for his first postoperative visit status post a left total knee replacement.  He has been compliant with a baby aspirin  twice daily.  He reports good range of motion and strength.  He is ambulating with a walker.  He says physical therapy from a home health standpoint has released him and he does not feel like he needs outpatient therapy.  On my exam today the staples have been removed and Steri-Strips applied to his left knee.  His calf is soft.  There is a lot of bruising and swelling to be expected.  His extension is almost full and his flexion is to at least 95 degrees.  He is confident that he can do this on his own without outpatient therapy.  With that being said we will see him back in just 2 weeks for repeat exam to see how he is coming along.  No x-rays are needed.  If he is not making progress at that visit with his range of motion we could always set him up for outpatient therapy.

## 2024-01-25 NOTE — Telephone Encounter (Signed)
 Right knee gel injection

## 2024-02-01 ENCOUNTER — Telehealth: Payer: Self-pay | Admitting: Orthopaedic Surgery

## 2024-02-01 NOTE — Telephone Encounter (Signed)
 Patient called and said that the Oxycodone  is making him constipated and is there anything else that could help? Also can he get an refill on the muscle relaxers. CB#[phone]

## 2024-02-02 NOTE — Telephone Encounter (Signed)
 Patient aware to drink more water and take a stool softner

## 2024-02-08 ENCOUNTER — Other Ambulatory Visit: Payer: Self-pay

## 2024-02-08 ENCOUNTER — Telehealth: Payer: Self-pay

## 2024-02-08 DIAGNOSIS — M1712 Unilateral primary osteoarthritis, left knee: Secondary | ICD-10-CM

## 2024-02-08 DIAGNOSIS — M1711 Unilateral primary osteoarthritis, right knee: Secondary | ICD-10-CM

## 2024-02-08 NOTE — Telephone Encounter (Signed)
 VOB previously approved.

## 2024-02-08 NOTE — Telephone Encounter (Signed)
 Error

## 2024-02-10 ENCOUNTER — Encounter: Payer: Self-pay | Admitting: Physician Assistant

## 2024-02-10 ENCOUNTER — Ambulatory Visit (INDEPENDENT_AMBULATORY_CARE_PROVIDER_SITE_OTHER): Admitting: Physician Assistant

## 2024-02-10 DIAGNOSIS — Z96652 Presence of left artificial knee joint: Secondary | ICD-10-CM

## 2024-02-10 NOTE — Progress Notes (Signed)
 HPI: 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  States overall he is doing well.  He states he is stiff whenever he first begins to ambulate but this walks out.  He is wanting to hold off on the right knee Durling injection until July at this point in time.  He has been doing therapy on his own at home and feels like he is making good progress.  Describes his pain is mild and only taking Tylenol  for pain at this point in time.  Review of systems: Denies any fevers chills.  Physical exam: General Well-developed well-nourished male who walks with a slow antalgic gait without any assistive device. Left knee: Lacks full extension by few degrees.  Flexion to 105 degrees.  No instability valgus varus stressing.  Surgical incisions well-healed.  Still has significant areas of ecchymosis posterior and medial knee region.  No signs of infection.  Impression: Status post left total knee arthroplasty  Plan: He will continue work on range of motion strengthening.  See his back in 1 month follow-up of the left knee.  Questions were encouraged and answered at length.

## 2024-03-09 ENCOUNTER — Encounter: Payer: Self-pay | Admitting: Physician Assistant

## 2024-03-09 ENCOUNTER — Ambulatory Visit (INDEPENDENT_AMBULATORY_CARE_PROVIDER_SITE_OTHER): Admitting: Physician Assistant

## 2024-03-09 DIAGNOSIS — Z96652 Presence of left artificial knee joint: Secondary | ICD-10-CM

## 2024-03-09 NOTE — Progress Notes (Signed)
 HPI: 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  He states he is overall doing well does have some stiffness after sitting for too long.  He states he has minimal pain.  He is continues to work with therapy on range of motion strengthening.  He is taking Tylenol  for pain at this point in time.  Review of systems: See HPI otherwise negative  Physical exam: General Well-developed well-nourished male no acute distress.  Left knee full extension flexion to 110 degrees.  Calf supple nontender.  No instability valgus varus stressing.  Impression: Status post left total knee arthroplasty  Plan: He will continue work on scar tissue mobilization.  Continue work on strengthening range of motion knee.  He is due back in July for viscosupplementation injection right knee.  Regards to the left knee we did not need to see him back until 47-month mark from surgery at that time we will obtain an AP and lateral view of the left knee.  He will follow-up with us  sooner for the left knee if he has any questions concerns.

## 2024-04-21 ENCOUNTER — Ambulatory Visit: Admitting: Physician Assistant

## 2024-04-21 ENCOUNTER — Encounter: Payer: Self-pay | Admitting: Physician Assistant

## 2024-04-21 DIAGNOSIS — M1711 Unilateral primary osteoarthritis, right knee: Secondary | ICD-10-CM

## 2024-04-21 MED ORDER — SODIUM HYALURONATE 60 MG/3ML IX PRSY
60.0000 mg | PREFILLED_SYRINGE | INTRA_ARTICULAR | Status: AC | PRN
  Administered 2024-04-21: 60 mg via INTRA_ARTICULAR

## 2024-04-21 NOTE — Progress Notes (Signed)
   Procedure Note  Patient: Steve Buchanan             Date of Birth: 1946/09/08           MRN: [id number]             Visit Date: 04/21/2024  HPI: Steve Buchanan returns today for scheduled Durolane injection of his right knee.  Again he is somewhat known arthritis of the right knee.  He said no new injury.  He has failed conservative treatment which is included exercise, over-the-counter medications and cortisone injection.  He has no scheduled surgery on the right knee in the next 6 months. Left total knee overall doing better he feels that he is having more good days and bad days.  Still some warmth and stiffness at times.  Physical exam: Right knee: Good range of motion no abnormal erythema or effusion. Left knee well-healed surgical incision full extension flexion to 110 to 115 degrees.  No abnormal warmth erythema or signs of infection.  Procedures: Visit Diagnoses:  1. Unilateral primary osteoarthritis, right knee     Large Joint Inj: R knee on 04/21/2024 1:12 PM Indications: pain Details: 22 G 1.5 in needle, anterolateral approach  Arthrogram: No  Medications: 60 mg Sodium Hyaluronate 60 MG/3ML Outcome: tolerated well, no immediate complications Procedure, treatment alternatives, risks and benefits explained, specific risks discussed. Consent was given by the patient. Immediately prior to procedure a time out was called to verify the correct patient, procedure, equipment, support staff and site/side marked as required. Patient was prepped and draped in the usual sterile fashion.    Plan: Will have him follow-up for his left total knee arthroplasty in early October we will obtain AP and lateral views of that knee at that time.  In regards to the right knee he understands to wait 6 months between viscosupplementation's.

## 2024-07-13 ENCOUNTER — Encounter: Payer: Self-pay | Admitting: Physician Assistant

## 2024-07-13 ENCOUNTER — Ambulatory Visit: Admitting: Physician Assistant

## 2024-07-13 ENCOUNTER — Other Ambulatory Visit: Payer: Self-pay

## 2024-07-13 DIAGNOSIS — Z96652 Presence of left artificial knee joint: Secondary | ICD-10-CM

## 2024-07-13 NOTE — Progress Notes (Signed)
 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  States left knee is doing well.  He has no complaints.  Regards to his right knee which he received gel injection in 04/21/2024 he states that it helped a lot the best shot I  ever got.  Review of systems: See HPI otherwise negative  Physical exam: Right knee excellent range of motion without pain.  No instability valgus varus stressing.  Surgical incisions well-healed.  Calf supple nontender.  Right knee varus deformity overall good range of motion.  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  Follow-up with us  at 1 year postop for AP and lateral views of the left knee sooner if there is any questions, concerns.  In regards to the right knee he knows to wait at least 6 months between viscosupplementation injections.

## 2024-10-12 ENCOUNTER — Other Ambulatory Visit: Payer: Self-pay

## 2024-10-12 DIAGNOSIS — M1711 Unilateral primary osteoarthritis, right knee: Secondary | ICD-10-CM

## 2024-11-02 ENCOUNTER — Ambulatory Visit: Admitting: Orthopaedic Surgery

## 2024-11-07 ENCOUNTER — Ambulatory Visit: Admitting: Physician Assistant

## 2024-11-17 ENCOUNTER — Ambulatory Visit: Admitting: Physician Assistant
